# Patient Record
Sex: Male | Born: 1965 | Race: White | Hispanic: No | Marital: Single | Smoking: Never smoker
Health system: Southern US, Community
[De-identification: ages and names within clinical notes are randomized; demographics above are authoritative.]

## PROBLEM LIST (undated history)

## (undated) DIAGNOSIS — E119 Type 2 diabetes mellitus without complications: Secondary | ICD-10-CM

## (undated) DIAGNOSIS — I10 Essential (primary) hypertension: Secondary | ICD-10-CM

## (undated) DIAGNOSIS — E785 Hyperlipidemia, unspecified: Secondary | ICD-10-CM

## (undated) HISTORY — PX: PILONIDAL CYST / SINUS EXCISION: SUR543

## (undated) HISTORY — DX: Essential (primary) hypertension: I10

## (undated) HISTORY — DX: Hyperlipidemia, unspecified: E78.5

---

## 2014-09-03 ENCOUNTER — Emergency Department: Payer: Self-pay

## 2014-09-03 ENCOUNTER — Other Ambulatory Visit: Payer: Self-pay

## 2014-09-03 ENCOUNTER — Encounter: Payer: Self-pay | Admitting: *Deleted

## 2014-09-03 DIAGNOSIS — N200 Calculus of kidney: Secondary | ICD-10-CM | POA: Insufficient documentation

## 2014-09-03 LAB — CBC WITH DIFFERENTIAL/PLATELET
BASOS PCT: 0 %
Basophils Absolute: 0 10*3/uL (ref 0–0.1)
EOS ABS: 0 10*3/uL (ref 0–0.7)
EOS PCT: 1 %
HCT: 47.3 % (ref 40.0–52.0)
HEMOGLOBIN: 16.3 g/dL (ref 13.0–18.0)
LYMPHS PCT: 28 %
Lymphs Abs: 2.7 10*3/uL (ref 1.0–3.6)
MCH: 31.4 pg (ref 26.0–34.0)
MCHC: 34.5 g/dL (ref 32.0–36.0)
MCV: 91 fL (ref 80.0–100.0)
MONOS PCT: 6 %
Monocytes Absolute: 0.6 10*3/uL (ref 0.2–1.0)
Neutro Abs: 6.3 10*3/uL (ref 1.4–6.5)
Neutrophils Relative %: 65 %
PLATELETS: 208 10*3/uL (ref 150–440)
RBC: 5.19 MIL/uL (ref 4.40–5.90)
RDW: 13.3 % (ref 11.5–14.5)
WBC: 9.6 10*3/uL (ref 3.8–10.6)

## 2014-09-03 LAB — COMPREHENSIVE METABOLIC PANEL
ALT: 141 U/L — ABNORMAL HIGH (ref 17–63)
AST: 92 U/L — ABNORMAL HIGH (ref 15–41)
Albumin: 4.4 g/dL (ref 3.5–5.0)
Alkaline Phosphatase: 59 U/L (ref 38–126)
Anion gap: 10 (ref 5–15)
BUN: 21 mg/dL — AB (ref 6–20)
CALCIUM: 9.1 mg/dL (ref 8.9–10.3)
CO2: 27 mmol/L (ref 22–32)
CREATININE: 1.61 mg/dL — AB (ref 0.61–1.24)
Chloride: 104 mmol/L (ref 101–111)
GFR calc Af Amer: 56 mL/min — ABNORMAL LOW (ref >60)
GFR calc non Af Amer: 49 mL/min — ABNORMAL LOW (ref >60)
GLUCOSE: 148 mg/dL — AB (ref 65–99)
Potassium: 4 mmol/L (ref 3.5–5.1)
Sodium: 141 mmol/L (ref 135–145)
TOTAL PROTEIN: 8.1 g/dL (ref 6.5–8.1)
Total Bilirubin: 0.5 mg/dL (ref 0.3–1.2)

## 2014-09-03 LAB — URINALYSIS COMPLETE WITH MICROSCOPIC (ARMC ONLY)
Bacteria, UA: NONE SEEN
Bilirubin Urine: NEGATIVE
GLUCOSE, UA: 50 mg/dL — AB
LEUKOCYTES UA: NEGATIVE
NITRITE: NEGATIVE
PH: 5 (ref 5.0–8.0)
Protein, ur: 30 mg/dL — AB
SPECIFIC GRAVITY, URINE: 1.033 — AB (ref 1.005–1.030)
SQUAMOUS EPITHELIAL / LPF: NONE SEEN

## 2014-09-03 LAB — TROPONIN I

## 2014-09-03 MED ORDER — OXYCODONE-ACETAMINOPHEN 5-325 MG PO TABS
1.0000 | ORAL_TABLET | Freq: Once | ORAL | Status: AC
Start: 2014-09-03 — End: 2014-09-03
  Administered 2014-09-03: 1 via ORAL

## 2014-09-03 MED ORDER — OXYCODONE-ACETAMINOPHEN 5-325 MG PO TABS
ORAL_TABLET | ORAL | Status: AC
  Administered 2014-09-03: 1 via ORAL
  Filled 2014-09-03: qty 1

## 2014-09-03 MED ORDER — ONDANSETRON 8 MG PO TBDP
ORAL_TABLET | ORAL | Status: AC
  Administered 2014-09-03: 8 mg via ORAL
  Filled 2014-09-03: qty 1

## 2014-09-03 MED ORDER — ONDANSETRON 8 MG PO TBDP
8.0000 mg | ORAL_TABLET | Freq: Once | ORAL | Status: AC
  Administered 2014-09-03: 8 mg via ORAL

## 2014-09-03 NOTE — ED Notes (Signed)
Pt states left sided flank pain that began at 1700 today. Pt denies nausea, vomiting, fever, or chills. Pt states no known hematuria.

## 2014-09-04 ENCOUNTER — Emergency Department
Admission: EM | Admit: 2014-09-04 | Discharge: 2014-09-04 | Disposition: A | Discharge status: Home / Self Care | Payer: Self-pay | Attending: Emergency Medicine | Admitting: Emergency Medicine

## 2014-09-04 DIAGNOSIS — N200 Calculus of kidney: Secondary | ICD-10-CM

## 2014-09-04 MED ORDER — ONDANSETRON 4 MG PO TBDP
4.0000 mg | ORAL_TABLET | Freq: Once | ORAL | Status: AC
  Administered 2014-09-04: 4 mg via ORAL

## 2014-09-04 MED ORDER — IBUPROFEN 800 MG PO TABS: 800.0000 mg | ORAL_TABLET | Freq: Three times a day (TID) | ORAL | Status: AC | PRN

## 2014-09-04 MED ORDER — ONDANSETRON HCL 4 MG PO TABS: 4.0000 mg | ORAL_TABLET | Freq: Three times a day (TID) | ORAL | Status: DC | PRN

## 2014-09-04 MED ORDER — OXYCODONE-ACETAMINOPHEN 5-325 MG PO TABS
1.0000 | ORAL_TABLET | Freq: Once | ORAL | Status: AC
  Administered 2014-09-04: 1 via ORAL

## 2014-09-04 MED ORDER — TAMSULOSIN HCL 0.4 MG PO CAPS
0.4000 mg | ORAL_CAPSULE | Freq: Once | ORAL | Status: AC
  Administered 2014-09-04: 0.4 mg via ORAL

## 2014-09-04 MED ORDER — ONDANSETRON 4 MG PO TBDP
ORAL_TABLET | ORAL | Status: AC
  Filled 2014-09-04: qty 1

## 2014-09-04 MED ORDER — KETOROLAC TROMETHAMINE 30 MG/ML IJ SOLN
30.0000 mg | Freq: Once | INTRAMUSCULAR | Status: AC
  Administered 2014-09-04: 30 mg via INTRAVENOUS

## 2014-09-04 MED ORDER — OXYCODONE-ACETAMINOPHEN 5-325 MG PO TABS
ORAL_TABLET | ORAL | Status: AC
  Filled 2014-09-04: qty 1

## 2014-09-04 MED ORDER — TAMSULOSIN HCL 0.4 MG PO CAPS: 0.4000 mg | ORAL_CAPSULE | Freq: Once | ORAL | Status: DC

## 2014-09-04 MED ORDER — OXYCODONE-ACETAMINOPHEN 5-325 MG PO TABS: 1.0000 | ORAL_TABLET | ORAL | Status: DC | PRN

## 2014-09-04 MED ORDER — TAMSULOSIN HCL 0.4 MG PO CAPS
ORAL_CAPSULE | ORAL | Status: AC
  Filled 2014-09-04: qty 1

## 2014-09-04 MED ORDER — KETOROLAC TROMETHAMINE 30 MG/ML IJ SOLN
INTRAMUSCULAR | Status: AC
  Filled 2014-09-04: qty 1

## 2014-09-04 MED ORDER — SODIUM CHLORIDE 0.9 % IV BOLUS (SEPSIS)
1000.0000 mL | Freq: Once | INTRAVENOUS | Status: AC
  Administered 2014-09-04: 1000 mL via INTRAVENOUS

## 2014-09-04 NOTE — Discharge Instructions (Signed)
1. Take medicines as needed for pain & nausea (Motrin/Percocet/Zofran). 2. Take Flomax as prescribed. 3. Drink plenty of bottled or filtered water daily. 4. Return to the ER for worsening symptoms, persistent vomiting, fever, difficulty breathing or other concerns.  Kidney Stones Kidney stones (urolithiasis) are deposits that form inside your kidneys. The intense pain is caused by the stone moving through the urinary tract. When the stone moves, the ureter goes into spasm around the stone. The stone is usually passed in the urine.  CAUSES   A disorder that makes certain neck glands produce too much parathyroid hormone (primary hyperparathyroidism).  A buildup of uric acid crystals, similar to gout in your joints.  Narrowing (stricture) of the ureter.  A kidney obstruction present at birth (congenital obstruction).  Previous surgery on the kidney or ureters.  Numerous kidney infections. SYMPTOMS   Feeling sick to your stomach (nauseous).  Throwing up (vomiting).  Blood in the urine (hematuria).  Pain that usually spreads (radiates) to the groin.  Frequency or urgency of urination. DIAGNOSIS   Taking a history and physical exam.  Blood or urine tests.  CT scan.  Occasionally, an examination of the inside of the urinary bladder (cystoscopy) is performed. TREATMENT   Observation.  Increasing your fluid intake.  Extracorporeal shock wave lithotripsy--This is a noninvasive procedure that uses shock waves to break up kidney stones.  Surgery may be needed if you have severe pain or persistent obstruction. There are various surgical procedures. Most of the procedures are performed with the use of small instruments. Only small incisions are needed to accommodate these instruments, so recovery time is minimized. The size, location, and chemical composition are all important variables that will determine the proper choice of action for you. Talk to your health care provider to  better understand your situation so that you will minimize the risk of injury to yourself and your kidney.  HOME CARE INSTRUCTIONS   Drink enough water and fluids to keep your urine clear or pale yellow. This will help you to pass the stone or stone fragments.  Strain all urine through the provided strainer. Keep all particulate matter and stones for your health care provider to see. The stone causing the pain may be as small as a grain of salt. It is very important to use the strainer each and every time you pass your urine. The collection of your stone will allow your health care provider to analyze it and verify that a stone has actually passed. The stone analysis will often identify what you can do to reduce the incidence of recurrences.  Only take over-the-counter or prescription medicines for pain, discomfort, or fever as directed by your health care provider.  Make a follow-up appointment with your health care provider as directed.  Get follow-up X-rays if required. The absence of pain does not always mean that the stone has passed. It may have only stopped moving. If the urine remains completely obstructed, it can cause loss of kidney function or even complete destruction of the kidney. It is your responsibility to make sure X-rays and follow-ups are completed. Ultrasounds of the kidney can show blockages and the status of the kidney. Ultrasounds are not associated with any radiation and can be performed easily in a matter of minutes. SEEK MEDICAL CARE IF:  You experience pain that is progressive and unresponsive to any pain medicine you have been prescribed. SEEK IMMEDIATE MEDICAL CARE IF:   Pain cannot be controlled with the prescribed medicine.  You have a fever or shaking chills.  The severity or intensity of pain increases over 18 hours and is not relieved by pain medicine.  You develop a new onset of abdominal pain.  You feel faint or pass out.  You are unable to  urinate. MAKE SURE YOU:   Understand these instructions.  Will watch your condition.  Will get help right away if you are not doing well or get worse. Document Released: 03/24/2005 Document Revised: 11/24/2012 Document Reviewed: 08/25/2012 [facility] Patient Information 2015 [place], [place]. This information is not intended to replace advice given to you by your health care provider. Make sure you discuss any questions you have with your health care provider.

## 2014-09-04 NOTE — ED Provider Notes (Signed)
[facility] Emergency Department Provider Note  ____________________________________________  Time seen: Approximately 4:30 AM  I have reviewed the triage vital signs and the nursing notes.   HISTORY  Chief Complaint Flank Pain    HPI Jeffrey Boyle is a 49 y.o. male who presents with left-sided flank pain onset approximately 5 PM. Patient describes sudden onset, nonradiating left flank pain, maximally 10/10. Patient has not experienced pain like this previously. Nothing made the pain better or worse. Patient denies nausea, vomiting, diarrhea, dysuria, testicular tenderness or swelling. Patient denies trauma/injury.   Past Medical History No history of kidney stones  There are no active problems to display for this patient.   Past Surgical History  Procedure Laterality Date  . Pilonidal cyst / sinus excision     Medications None  Allergies Review of patient's allergies indicates no known allergies.  History reviewed. No pertinent family history.  Social History History  Substance Use Topics  . Smoking status: Never Smoker   . Smokeless tobacco: Never Used  . Alcohol Use: Yes    Review of Systems Constitutional: No fever/chills Eyes: No visual changes. ENT: No sore throat. Cardiovascular: Denies chest pain. Respiratory: Denies shortness of breath. Gastrointestinal: No abdominal pain.  No nausea, no vomiting.  No diarrhea.  No constipation. Genitourinary: Negative for dysuria. Musculoskeletal: Positive for back pain. Skin: Negative for rash. Neurological: Negative for headaches, focal weakness or numbness.  10-point ROS otherwise negative.  ____________________________________________   PHYSICAL EXAM:  VITAL SIGNS: ED Triage Vitals  Enc Vitals Group     BP 09/03/14 2153 133/95 mmHg     Pulse Rate 09/03/14 2153 98     Resp 09/03/14 2153 14     Temp 09/03/14 2153 98.4 F (36.9 C)     Temp src --      SpO2 09/03/14 2153 95 %      Weight 09/03/14 2153 210 lb (95.255 kg)     Height 09/03/14 2153  (1.727 m)     Head Cir --      Peak Flow --      Pain Score 09/03/14 2153 8     Pain Loc --      Pain Edu? --      Excl. in GC? --     Constitutional: Alert and oriented. Well appearing and in no acute distress. Eyes: Conjunctivae are normal. PERRL. EOMI. Head: Atraumatic. Nose: No congestion/rhinnorhea. Mouth/Throat: Mucous membranes are moist.  Oropharynx non-erythematous. Neck: No stridor.  Cardiovascular: Normal rate, regular rhythm. Grossly normal heart sounds.  Good peripheral circulation. Respiratory: Normal respiratory effort.  No retractions. Lungs CTAB. Gastrointestinal: Soft and nontender. No distention. No abdominal bruits. No CVA tenderness. Musculoskeletal: No lower extremity tenderness nor edema.  No joint effusions. Neurologic:  Normal speech and language. No gross focal neurologic deficits are appreciated. Speech is normal. No gait instability. Skin:  Skin is warm, dry and intact. No rash noted. Psychiatric: Mood and affect are normal. Speech and behavior are normal.  ____________________________________________   LABS (all labs ordered are listed, but only abnormal results are displayed)  Labs Reviewed  COMPREHENSIVE METABOLIC PANEL - Abnormal; Notable for the following:    Glucose, Bld 148 (*)    BUN 21 (*)    Creatinine, Ser 1.61 (*)    AST 92 (*)    ALT 141 (*)    GFR calc non Af Amer 49 (*)    GFR calc Af Amer 56 (*)    All other components  within normal limits  URINALYSIS COMPLETEWITH MICROSCOPIC ([facility] ONLY) - Abnormal; Notable for the following:    Color, Urine YELLOW (*)    APPearance HAZY (*)    Glucose, UA 50 (*)    Ketones, ur TRACE (*)    Specific Gravity, Urine 1.033 (*)    Hgb urine dipstick 3+ (*)    Protein, ur 30 (*)    All other components within normal limits  CBC WITH DIFFERENTIAL/PLATELET  TROPONIN I    ____________________________________________  EKG  ED ECG REPORT I, Kimi Kroft J, the attending physician, personally viewed and interpreted this ECG.   Date: 09/04/2014  EKG Time: 2215  Rate: 86  Rhythm: normal EKG, normal sinus rhythm  Axis: Normal  Intervals:none  ST&T Change: Nonspecific  ____________________________________________  RADIOLOGY  CT abdomen and pelvis without contrast interpreted per Dr. Manus GunningEhinger: 1. Obstructing 2 mm stone at the left ureterovesicular junction with resultant mild hydroureteronephrosis and perinephric stranding. Additional nonobstructing stones in both kidneys. 2. Incidental findings of hepatic steatosis and diverticulosis, no diverticulitis. 3. Stomach distended with ingested contents. This can be seen in the setting of recent meal ingestion, correlation recommended to exclude gastroparesis.  ____________________________________________   PROCEDURES  Procedure(s) performed: None  Critical Care performed: No  ____________________________________________   INITIAL IMPRESSION / ASSESSMENT AND PLAN / ED COURSE  Pertinent labs & imaging results that were available during my care of the patient were reviewed by me and considered in my medical decision making (see chart for details).  49 year old male with left flank pain secondary to nephrolithiasis. Pain-free after IV Toradol and fluids. Plan for analgesia, antiemetic, Flomax and PCP follow-up. Strict return precautions given. Patient verbalizes understanding and agrees with plan of care. ____________________________________________   FINAL CLINICAL IMPRESSION(S) / ED DIAGNOSES  Final diagnoses:  Kidney stones      Irean HongJade J Khy Pitre, MD 09/04/14 [phone]

## 2018-06-01 ENCOUNTER — Emergency Department: Payer: BLUE CROSS/BLUE SHIELD

## 2018-06-01 ENCOUNTER — Inpatient Hospital Stay
Admission: EM | Admit: 2018-06-01 | Discharge: 2018-06-07 | DRG: 392 | Disposition: A | Discharge status: Home / Self Care | Payer: BLUE CROSS/BLUE SHIELD | Attending: General Surgery | Admitting: General Surgery

## 2018-06-01 ENCOUNTER — Encounter: Payer: Self-pay | Admitting: Emergency Medicine

## 2018-06-01 DIAGNOSIS — R1032 Left lower quadrant pain: Secondary | ICD-10-CM | POA: Diagnosis present

## 2018-06-01 DIAGNOSIS — N420 Calculus of prostate: Secondary | ICD-10-CM | POA: Diagnosis present

## 2018-06-01 DIAGNOSIS — Z87442 Personal history of urinary calculi: Secondary | ICD-10-CM

## 2018-06-01 DIAGNOSIS — N200 Calculus of kidney: Secondary | ICD-10-CM | POA: Diagnosis present

## 2018-06-01 DIAGNOSIS — B9620 Unspecified Escherichia coli [E. coli] as the cause of diseases classified elsewhere: Secondary | ICD-10-CM | POA: Diagnosis present

## 2018-06-01 DIAGNOSIS — K5720 Diverticulitis of large intestine with perforation and abscess without bleeding: Principal | ICD-10-CM | POA: Diagnosis present

## 2018-06-01 DIAGNOSIS — Z9049 Acquired absence of other specified parts of digestive tract: Secondary | ICD-10-CM

## 2018-06-01 DIAGNOSIS — Z6832 Body mass index (BMI) 32.0-32.9, adult: Secondary | ICD-10-CM

## 2018-06-01 DIAGNOSIS — K449 Diaphragmatic hernia without obstruction or gangrene: Secondary | ICD-10-CM | POA: Diagnosis present

## 2018-06-01 DIAGNOSIS — E669 Obesity, unspecified: Secondary | ICD-10-CM | POA: Diagnosis present

## 2018-06-01 DIAGNOSIS — Z79899 Other long term (current) drug therapy: Secondary | ICD-10-CM

## 2018-06-01 DIAGNOSIS — J101 Influenza due to other identified influenza virus with other respiratory manifestations: Secondary | ICD-10-CM | POA: Diagnosis present

## 2018-06-01 DIAGNOSIS — K5792 Diverticulitis of intestine, part unspecified, without perforation or abscess without bleeding: Secondary | ICD-10-CM

## 2018-06-01 DIAGNOSIS — I700 Atherosclerosis of aorta: Secondary | ICD-10-CM | POA: Diagnosis present

## 2018-06-01 DIAGNOSIS — J329 Chronic sinusitis, unspecified: Secondary | ICD-10-CM | POA: Diagnosis present

## 2018-06-01 DIAGNOSIS — K668 Other specified disorders of peritoneum: Secondary | ICD-10-CM | POA: Diagnosis present

## 2018-06-01 LAB — URINALYSIS, COMPLETE (UACMP) WITH MICROSCOPIC
Bacteria, UA: NONE SEEN
Bilirubin Urine: NEGATIVE
Glucose, UA: >=500 mg/dL — AB
Ketones, ur: NEGATIVE mg/dL
Leukocytes,Ua: NEGATIVE
Nitrite: NEGATIVE
Protein, ur: NEGATIVE mg/dL
SPECIFIC GRAVITY, URINE: 1.039 — AB (ref 1.005–1.030)
SQUAMOUS EPITHELIAL / LPF: NONE SEEN (ref 0–5)
pH: 5 (ref 5.0–8.0)

## 2018-06-01 LAB — COMPREHENSIVE METABOLIC PANEL
ALBUMIN: 3.7 g/dL (ref 3.5–5.0)
ALT: 159 U/L — ABNORMAL HIGH (ref 0–44)
AST: 88 U/L — ABNORMAL HIGH (ref 15–41)
Alkaline Phosphatase: 145 U/L — ABNORMAL HIGH (ref 38–126)
Anion gap: 12 (ref 5–15)
BILIRUBIN TOTAL: 1 mg/dL (ref 0.3–1.2)
BUN: 25 mg/dL — ABNORMAL HIGH (ref 6–20)
CO2: 24 mmol/L (ref 22–32)
Calcium: 8.9 mg/dL (ref 8.9–10.3)
Chloride: 95 mmol/L — ABNORMAL LOW (ref 98–111)
Creatinine, Ser: 1.18 mg/dL (ref 0.61–1.24)
GFR calc Af Amer: >60 mL/min (ref >60)
GFR calc non Af Amer: >60 mL/min (ref >60)
GLUCOSE: 308 mg/dL — AB (ref 70–99)
Potassium: 4 mmol/L (ref 3.5–5.1)
Sodium: 131 mmol/L — ABNORMAL LOW (ref 135–145)
TOTAL PROTEIN: 7.7 g/dL (ref 6.5–8.1)

## 2018-06-01 LAB — CBC WITH DIFFERENTIAL/PLATELET
Abs Immature Granulocytes: 0.08 10*3/uL — ABNORMAL HIGH (ref 0.00–0.07)
BASOS ABS: 0 10*3/uL (ref 0.0–0.1)
Basophils Relative: 0 %
Eosinophils Absolute: 0 10*3/uL (ref 0.0–0.5)
Eosinophils Relative: 0 %
HEMATOCRIT: 49.5 % (ref 39.0–52.0)
HEMOGLOBIN: 16.8 g/dL (ref 13.0–17.0)
IMMATURE GRANULOCYTES: 1 %
LYMPHS ABS: 3 10*3/uL (ref 0.7–4.0)
LYMPHS PCT: 21 %
MCH: 30.5 pg (ref 26.0–34.0)
MCHC: 33.9 g/dL (ref 30.0–36.0)
MCV: 89.8 fL (ref 80.0–100.0)
Monocytes Absolute: 1.2 10*3/uL — ABNORMAL HIGH (ref 0.1–1.0)
Monocytes Relative: 8 %
NEUTROS PCT: 70 %
NRBC: 0 % (ref 0.0–0.2)
Neutro Abs: 10.1 10*3/uL — ABNORMAL HIGH (ref 1.7–7.7)
Platelets: 278 10*3/uL (ref 150–400)
RBC: 5.51 MIL/uL (ref 4.22–5.81)
RDW: 11.8 % (ref 11.5–15.5)
WBC: 14.4 10*3/uL — ABNORMAL HIGH (ref 4.0–10.5)

## 2018-06-01 LAB — INFLUENZA PANEL BY PCR (TYPE A & B)
Influenza A By PCR: POSITIVE — AB
Influenza B By PCR: NEGATIVE

## 2018-06-01 MED ORDER — PIPERACILLIN-TAZOBACTAM 3.375 G IVPB
3.3750 g | Freq: Three times a day (TID) | INTRAVENOUS | Status: DC
  Administered 2018-06-01 – 2018-06-07 (×18): 3.375 g via INTRAVENOUS
  Filled 2018-06-01 (×18): qty 50

## 2018-06-01 MED ORDER — ENOXAPARIN SODIUM 40 MG/0.4ML ~~LOC~~ SOLN
40.0000 mg | SUBCUTANEOUS | Status: DC
  Administered 2018-06-01 – 2018-06-06 (×6): 40 mg via SUBCUTANEOUS
  Filled 2018-06-01 (×6): qty 0.4

## 2018-06-01 MED ORDER — ONDANSETRON 4 MG PO TBDP: 4.0000 mg | ORAL_TABLET | Freq: Four times a day (QID) | ORAL | Status: DC | PRN

## 2018-06-01 MED ORDER — SODIUM CHLORIDE 0.9 % IV SOLN
INTRAVENOUS | Status: DC | PRN
  Administered 2018-06-01 – 2018-06-07 (×8): 250 mL via INTRAVENOUS

## 2018-06-01 MED ORDER — SODIUM CHLORIDE 0.9 % IV SOLN
Freq: Once | INTRAVENOUS | Status: AC
Start: 2018-06-01 — End: 2018-06-01
  Administered 2018-06-01: 09:00:00 via INTRAVENOUS

## 2018-06-01 MED ORDER — KETOROLAC TROMETHAMINE 30 MG/ML IJ SOLN: 30.0000 mg | Freq: Four times a day (QID) | INTRAMUSCULAR | Status: DC | PRN

## 2018-06-01 MED ORDER — PIPERACILLIN-TAZOBACTAM 3.375 G IVPB 30 MIN
3.3750 g | Freq: Once | INTRAVENOUS | Status: AC
  Administered 2018-06-01: 3.375 g via INTRAVENOUS
  Filled 2018-06-01: qty 50

## 2018-06-01 MED ORDER — SODIUM CHLORIDE 0.9 % IV SOLN
Freq: Once | INTRAVENOUS | Status: AC
Start: 2018-06-01 — End: 2018-06-01
  Administered 2018-06-01: 08:00:00 via INTRAVENOUS

## 2018-06-01 MED ORDER — KETOROLAC TROMETHAMINE 30 MG/ML IJ SOLN: 30.0000 mg | Freq: Four times a day (QID) | INTRAMUSCULAR | Status: DC

## 2018-06-01 MED ORDER — LACTATED RINGERS IV SOLN
INTRAVENOUS | Status: DC
  Administered 2018-06-01 – 2018-06-06 (×12): via INTRAVENOUS

## 2018-06-01 MED ORDER — MORPHINE SULFATE (PF) 4 MG/ML IV SOLN
4.0000 mg | Freq: Once | INTRAVENOUS | Status: AC
  Administered 2018-06-01: 4 mg via INTRAVENOUS
  Filled 2018-06-01: qty 1

## 2018-06-01 MED ORDER — METOPROLOL TARTRATE 5 MG/5ML IV SOLN
5.0000 mg | Freq: Four times a day (QID) | INTRAVENOUS | Status: DC | PRN
  Administered 2018-06-04: 5 mg via INTRAVENOUS
  Filled 2018-06-01: qty 5

## 2018-06-01 MED ORDER — ONDANSETRON HCL 4 MG/2ML IJ SOLN
4.0000 mg | Freq: Once | INTRAMUSCULAR | Status: AC
  Administered 2018-06-01: 4 mg via INTRAVENOUS
  Filled 2018-06-01: qty 2

## 2018-06-01 MED ORDER — MORPHINE SULFATE (PF) 2 MG/ML IV SOLN
2.0000 mg | INTRAVENOUS | Status: DC | PRN
  Administered 2018-06-01 – 2018-06-04 (×4): 2 mg via INTRAVENOUS
  Filled 2018-06-01 (×5): qty 1

## 2018-06-01 MED ORDER — ONDANSETRON HCL 4 MG/2ML IJ SOLN: 4.0000 mg | Freq: Four times a day (QID) | INTRAMUSCULAR | Status: DC | PRN

## 2018-06-01 MED ORDER — OSELTAMIVIR PHOSPHATE 75 MG PO CAPS
75.0000 mg | ORAL_CAPSULE | Freq: Two times a day (BID) | ORAL | Status: DC
  Filled 2018-06-01: qty 1

## 2018-06-01 MED ORDER — KETOROLAC TROMETHAMINE 30 MG/ML IJ SOLN
30.0000 mg | Freq: Four times a day (QID) | INTRAMUSCULAR | Status: AC | PRN
  Administered 2018-06-01 – 2018-06-05 (×10): 30 mg via INTRAVENOUS
  Filled 2018-06-01 (×10): qty 1

## 2018-06-01 MED ORDER — OSELTAMIVIR PHOSPHATE 75 MG PO CAPS
75.0000 mg | ORAL_CAPSULE | Freq: Two times a day (BID) | ORAL | Status: AC
  Administered 2018-06-01 – 2018-06-05 (×9): 75 mg via ORAL
  Filled 2018-06-01 (×8): qty 1

## 2018-06-01 NOTE — H&P (Addendum)
[facility] SURGICAL HISTORY & PHYSICAL (cpt [phone])  HISTORY OF PRESENT ILLNESS (HPI):  53 y.o. male presented to [facility] ED today for abdominal pain. Patient reports the acute onset of sharp LLQ abdominal pain around 4 AM this morning when he was getting up to go to the bathroom. He endorses associated fevers but denied any chills, CP, SOB, nausea, emesis, or bladder/bowel changes. No reports of similar pain in the past. The pain is exacerbated with movement. He has a history of kidney stones and thought this was similar and tried Azo without any change in his pain. Previous abdominal surgeries are positive for appendectomy. Of note, he has been on methylprednisolone 4mg  since Sunday, his last dose being Monday at 12 pm, for a sinus infection. Work up in the ED was concerning for perforated diverticulitis.   General surgery is consulted by emergency medicine physician Dr Cherene Julian, MD for evaluation and management of perforated diverticulitis.    PAST MEDICAL HISTORY (PMH):  History reviewed. No pertinent past medical history.  Reviewed. Otherwise negative.   PAST SURGICAL HISTORY (PSH):  Past Surgical History:  Procedure Laterality Date  . PILONIDAL CYST / SINUS EXCISION      Reviewed. Otherwise negative.   MEDICATIONS:  Prior to Admission medications   Medication Sig Start Date End Date Taking? Authorizing Provider  benzonatate (TESSALON) 200 MG capsule Take 200 mg by mouth 3 (three) times daily. 05/29/18   [provider]  ibuprofen (ADVIL,MOTRIN) 800 MG tablet Take 1 tablet (800 mg total) by mouth every 8 (eight) hours as needed for moderate pain. 09/04/14   Irean Hong, MD  methylPREDNISolone (MEDROL DOSEPAK) 4 MG TBPK tablet Take 4 mg by mouth as directed. 05/29/18   [provider]  ondansetron (ZOFRAN) 4 MG tablet Take 1 tablet (4 mg total) by mouth every 8 (eight) hours as needed for nausea or vomiting. 09/04/14   Irean Hong, MD   oxyCODONE-acetaminophen (ROXICET) 5-325 MG per tablet Take 1 tablet by mouth every 4 (four) hours as needed for severe pain. 09/04/14   Irean Hong, MD  tamsulosin (FLOMAX) 0.4 MG CAPS capsule Take 1 capsule (0.4 mg total) by mouth once. 09/04/14   Irean Hong, MD     ALLERGIES:  No Known Allergies   SOCIAL HISTORY:  Social History   Socioeconomic History  . Marital status: Single    Spouse name: Not on file  . Number of children: Not on file  . Years of education: Not on file  . Highest education level: Not on file  Occupational History  . Not on file  Social Needs  . Financial resource strain: Not on file  . Food insecurity:    Worry: Not on file    Inability: Not on file  . Transportation needs:    Medical: Not on file    Non-medical: Not on file  Tobacco Use  . Smoking status: Never Smoker  . Smokeless tobacco: Never Used  Substance and Sexual Activity  . Alcohol use: Yes  . Drug use: Not on file  . Sexual activity: Not on file  Lifestyle  . Physical activity:    Days per week: Not on file    Minutes per session: Not on file  . Stress: Not on file  Relationships  . Social connections:    Talks on phone: Not on file    Gets together: Not on file    Attends religious service: Not on file  Active member of club or organization: Not on file    Attends meetings of clubs or organizations: Not on file    Relationship status: Not on file  . Intimate partner violence:    Fear of current or ex partner: Not on file    Emotionally abused: Not on file    Physically abused: Not on file    Forced sexual activity: Not on file  Other Topics Concern  . Not on file  Social History Narrative  . Not on file     FAMILY HISTORY:  No family history on file.  Otherwise negative.   REVIEW OF SYSTEMS:  Review of Systems  Constitutional: Positive for fever. Negative for chills.  Respiratory: Negative for cough and sputum production.   Cardiovascular: Negative for chest  pain and palpitations.  Gastrointestinal: Positive for abdominal pain. Negative for blood in stool, constipation, diarrhea, nausea and vomiting.  Genitourinary: Negative for dysuria, hematuria and urgency.  Neurological: Negative for dizziness and headaches.  All other systems reviewed and are negative.   VITAL SIGNS:  Temp:  [100.4 F (38 C)-101.5 F (38.6 C)] 100.4 F (38 C) (02/25 0918) Pulse Rate:  [60] 60 (02/25 0755) Resp:  [18] 18 (02/25 0755) BP: (143-155)/(82-92) 143/92 (02/25 0918) SpO2:  [94 %-95 %] 94 % (02/25 0918) Weight:  [97.5 kg] 97.5 kg (02/25 0755)     Height:  (172.7 cm) Weight: 97.5 kg BMI (Calculated): 32.7   PHYSICAL EXAM:  Physical Exam Vitals signs and nursing note reviewed.  Constitutional:      General: He is not in acute distress.    Appearance: He is well-developed. He is obese. He is not ill-appearing.  HENT:     Head: Normocephalic and atraumatic.  Eyes:     General: No scleral icterus.    Extraocular Movements: Extraocular movements intact.  Cardiovascular:     Rate and Rhythm: Normal rate and regular rhythm.     Heart sounds: Normal heart sounds. No murmur. No friction rub. No gallop.   Pulmonary:     Effort: Pulmonary effort is normal. No respiratory distress.     Breath sounds: Normal breath sounds. No stridor. No wheezing.  Abdominal:     General: Abdomen is protuberant. There is no distension.     Palpations: Abdomen is soft.     Tenderness: There is abdominal tenderness in the suprapubic area and left lower quadrant. There is no guarding or rebound.  Genitourinary:    Comments: Deferred Skin:    General: Skin is warm and dry.     Coloration: Skin is not jaundiced or pale.  Neurological:     General: No focal deficit present.     Mental Status: He is alert and oriented to person, place, and time.  Psychiatric:        Mood and Affect: Mood normal.        Behavior: Behavior normal.     INTAKE/OUTPUT:  This shift: No  intake/output data recorded.  Last 2 shifts: @  Labs:  CBC Latest Ref Rng & Units 06/01/2018 09/03/2014  WBC 4.0 - 10.5 K/uL 14.4(H) 9.6  Hemoglobin 13.0 - 17.0 g/dL 28.4 13.2  Hematocrit 44.0 - 52.0 % 49.5 47.3  Platelets 150 - 400 K/uL 278 208   CMP Latest Ref Rng & Units 06/01/2018 09/03/2014  Glucose 70 - 99 mg/dL 102(V) 253(G)  BUN 6 - 20 mg/dL 64(Q) 03(K)  Creatinine 0.61 - 1.24 mg/dL 7.42 5.95(G)  Sodium 387 - 145 mmol/L  131(L) 141  Potassium 3.5 - 5.1 mmol/L 4.0 4.0  Chloride 98 - 111 mmol/L 95(L) 104  CO2 22 - 32 mmol/L 24 27  Calcium 8.9 - 10.3 mg/dL 8.9 9.1  Total Protein 6.5 - 8.1 g/dL 7.7 8.1  Total Bilirubin 0.3 - 1.2 mg/dL 1.0 0.5  Alkaline Phos 38 - 126 U/L 145(H) 59  AST 15 - 41 U/L 88(H) 92(H)  ALT 0 - 44 U/L 159(H) 141(H)     Imaging studies:   CT Renal Stone Study (2018/06/23) personally reviewed and radiologist reviewed:  IMPRESSION: 1) Pneumoperitoneum, apparently from perforation in the mid sigmoid colon region. A small amount of fluid as well as mesenteric stranding is noted in this area, presumably secondary to diverticulitis. No well-defined abscess evident. 2) No appreciable bowel obstruction.  Small hiatal hernia noted. 3) Nonobstructing calculi in each kidney, more on the right than on the left. No ureteral calculus or hydronephrosis on either side. There are occasional prostatic calculi. 4) There is aortic atherosclerosis.    Imaging was personally reviewed by Dr. Lady Gary.  The air is in scattered locules throughout the abdomen, rather than a single large-volume of free air.  Assessment/Plan: (ICD-10's: K54.20) 53 y.o. male with fever, leukocytosis, and LLQ abdominal pain as well as pneumoperitoneum on imaging studies most likely consistent with perforated diverticulitis which complicated by 3 days of methylprednisolone use as well as by pertinent comorbidities including obesity.    - Admit to general surgery service  - NPO, IVF  resuscitation (LR 125 ml/hr), IV ABx (Zosyn)   - Continue to monitor abdominal exam, on-going bowel function  - Pain control prn, antiemetics prn  - Morning labs (CBC/BMP)  - Given clinical picture, imaging findings (lack of abscess, limited free air), and current steroid use, will attempt conservative management as outlined above. The patient understands if he acutely worsens or does not respond he will have to have Hartman's Procedure which will likely result in temporarycolostomy. He is understanding and agreeable with this plan.    - Medical management of comorbidities   - DVT prophylaxis  All of the above findings and recommendations were discussed with the patient, and all of his questions were answered to his expressed satisfaction.  -- Lynden Oxford, PA-C [facility] 06/01/2018, 9:33 AM [phone] M-F: 7am - 4pm   I saw and evaluated the patient.  I agree with the above documentation, exam, and plan, which I have edited where appropriate. I also consulted with my partner, Dr. Everlene Farrier who reviewed the imaging along with me.  Based upon clinical presentation, will manage conservatively for now, but will reassess frequently and have a low threshold for altering the plan, should his clinical course dictate. Duanne Guess  10:14 AM

## 2018-06-01 NOTE — ED Notes (Signed)
Surgeon in room to assess patient.  Will continue to monitor.   

## 2018-06-01 NOTE — ED Notes (Signed)
Patient states he last drank something prior to arrival this morning, water, around 7am and last ate at 7pm yesterday.

## 2018-06-01 NOTE — ED Triage Notes (Addendum)
Patient presents to the ED with left lower abdominal pain that began suddenly at 4am.  Patient reports pain intermittently radiating into penis and scrotum.  Patient denies vomiting and diarrhea.  Patient states he has been taking prednisone for a sinus infection.  Patient is in no obvious distress at this time.  Patient's family members have recently been diagnosed with flu.  Patient denies vomiting and diarrhea.

## 2018-06-01 NOTE — ED Notes (Signed)
ED TO INPATIENT HANDOFF REPORT  Name/Age/Gender Jeffrey Boyle 53 y.o. male  Code Status    Code Status Orders  (From admission, onward)         Start     Ordered   06/01/18 0931  Full code  Continuous     06/01/18 0933        Code Status History    This patient has a current code status but no historical code status.      Home/SNF/Other Home  Chief Complaint abd pain  Level of Care/Admitting Diagnosis ED Disposition    ED Disposition Condition Comment   Admit  Hospital Area: [facility] [[id number]]  Level of Care: Med-Surg [16]  Diagnosis: Diverticulitis of large intestine with perforation without bleeding [[id number]]  Admitting Physician: Shana Chute  Attending Physician: Shana Chute  Estimated length of stay: 3 - 4 days  Certification:: I certify this patient will need inpatient services for at least 2 midnights  PT Class (Do Not Modify): Inpatient [101]  PT Acc Code (Do Not Modify): Private [1]       Medical History History reviewed. No pertinent past medical history.  Allergies No Known Allergies  IV Location/Drains/Wounds Patient Lines/Drains/Airways Status   Active Line/Drains/Airways    Name:   Placement date:   Placement time:   Site:   Days:   Peripheral IV 06/01/18 Right Antecubital   06/01/18    0818    Antecubital   less than 1   Peripheral IV 06/01/18 Left Hand   06/01/18    0913    Hand   less than 1          Labs/Imaging Results for orders placed or performed during the hospital encounter of 06/01/18 (from the past 48 hour(s))  CBC with Differential/Platelet     Status: Abnormal   Collection Time: 06/01/18  8:07 AM  Result Value Ref Range   WBC 14.4 (H) 4.0 - 10.5 K/uL   RBC 5.51 4.22 - 5.81 MIL/uL   Hemoglobin 16.8 13.0 - 17.0 g/dL   HCT 33.3 54.5 - 62.5 %   MCV 89.8 80.0 - 100.0 fL   MCH 30.5 26.0 - 34.0 pg   MCHC 33.9 30.0 - 36.0 g/dL   RDW 63.8 93.7 - 34.2 %   Platelets 278 150  - 400 K/uL   nRBC 0.0 0.0 - 0.2 %   Neutrophils Relative % 70 %   Neutro Abs 10.1 (H) 1.7 - 7.7 K/uL   Lymphocytes Relative 21 %   Lymphs Abs 3.0 0.7 - 4.0 K/uL   Monocytes Relative 8 %   Monocytes Absolute 1.2 (H) 0.1 - 1.0 K/uL   Eosinophils Relative 0 %   Eosinophils Absolute 0.0 0.0 - 0.5 K/uL   Basophils Relative 0 %   Basophils Absolute 0.0 0.0 - 0.1 K/uL   Immature Granulocytes 1 %   Abs Immature Granulocytes 0.08 (H) 0.00 - 0.07 K/uL    Comment: Performed at [facility], [address] [place], [place] [address]  Comprehensive metabolic panel     Status: Abnormal   Collection Time: 06/01/18  8:07 AM  Result Value Ref Range   Sodium 131 (L) 135 - 145 mmol/L   Potassium 4.0 3.5 - 5.1 mmol/L    Comment: HEMOLYSIS AT THIS LEVEL MAY AFFECT RESULT   Chloride 95 (L) 98 - 111 mmol/L   CO2 24 22 - 32 mmol/L   Glucose, Bld 308 (H) 70 - 99  mg/dL   BUN 25 (H) 6 - 20 mg/dL   Creatinine, Ser 1.18 0.61 - 1.24 mg/dL   Calcium 8.9 8.9 - 09.610.3 mg/dL   Total Protein 7.7 6.5 - 8.1 g/dL   Albumin 3.7 3.5 - 5.0 g/dL   AST 88 (H) 15 - 411.61 U/L   ALT 159 (H) 0 - 44 U/L   Alkaline Phosphatase 145 (H) 38 - 126 U/L   Total Bilirubin 1.0 0.3 - 1.2 mg/dL   GFR calc non Af Amer >60 >60 mL/min   GFR calc Af Amer >60 >60 mL/min   Anion gap 12 5 - 15    Comment: Performed at [facility], [address] [place], [place] [address]  Urinalysis, Complete w Microscopic     Status: Abnormal   Collection Time: 06/01/18  8:07 AM  Result Value Ref Range   Color, Urine YELLOW (A) YELLOW   APPearance CLEAR (A) CLEAR   Specific Gravity, Urine 1.039 (H) 1.005 - 1.030   pH 5.0 5.0 - 8.0   Glucose, UA >=500 (A) NEGATIVE mg/dL   Hgb urine dipstick MODERATE (A) NEGATIVE   Bilirubin Urine NEGATIVE NEGATIVE   Ketones, ur NEGATIVE NEGATIVE mg/dL   Protein, ur NEGATIVE NEGATIVE mg/dL   Nitrite NEGATIVE NEGATIVE   Leukocytes,Ua NEGATIVE NEGATIVE   RBC / HPF 6-10 0 - 5 RBC/hpf   WBC, UA 0-5  0 - 5 WBC/hpf   Bacteria, UA NONE SEEN NONE SEEN   Squamous Epithelial / LPF NONE SEEN 0 - 5   Mucus PRESENT    Hyaline Casts, UA PRESENT     Comment: Performed at [facility], [address] [place], [place] [address]   Ct Renal Stone Study  Result Date: 06/01/2018 CLINICAL DATA:  Lower abdominal pain, primarily left-sided EXAM: CT ABDOMEN AND PELVIS WITHOUT CONTRAST TECHNIQUE: Multidetector CT imaging of the abdomen and pelvis was performed following the standard protocol without oral or IV contrast. COMPARISON:  Sep 03, 2014 FINDINGS: Lower chest: Lung bases are clear.  There is a small hiatal hernia. Hepatobiliary: No focal liver lesions are apparent on this noncontrast enhanced study. Gallbladder wall is not appreciably thickened. There is no biliary duct dilatation. Pancreas: No pancreatic mass or inflammatory focus. Spleen: No splenic lesions are evident. Adrenals/Urinary Tract: Adrenals appear unremarkable bilaterally. Kidneys bilaterally show no evident mass or hydronephrosis on either side. There is a calculus in the mid right kidney measuring 8 x 8 mm. There are scattered 1-2 mm calculi elsewhere in the right kidney. There is a 2 mm calculus in the mid left kidney with several tiny calculi elsewhere in the left kidney. There is no appreciable ureteral calculus on either side. Urinary bladder is midline with wall thickness within normal limits. Stomach/Bowel: There are multiple sigmoid diverticula. There is stranding focally in the mid sigmoid colon consistent with a degree of diverticulitis. Note that there is evidence of perforation in this area with extraluminal air and fluid. Free air is seen scattered throughout the abdomen and pelvis. No well-defined abscess is seen. No bowel obstruction is evident. Vascular/Lymphatic: There is no abdominal aortic aneurysm. There is aortic atherosclerosis. No evident adenopathy in the abdomen or pelvis. Reproductive: There are occasional  prostatic calculi. Prostate and seminal vesicles are normal in size and contour. There is no pelvic mass evident. Other: There is no periappendiceal region inflammation. Appendix not well seen. There is no abscess or ascites in the abdomen or pelvis. Musculoskeletal: No blastic or lytic bone lesions.  No intramuscular or abdominal wall lesions are evident. IMPRESSION: 1. Pneumoperitoneum, apparently from perforation in the mid sigmoid colon region. A small amount of fluid as well as mesenteric stranding is noted in this area, presumably secondary to diverticulitis. No well-defined abscess evident. 2.  No appreciable bowel obstruction.  Small hiatal hernia noted. 3. Nonobstructing calculi in each kidney, more on the right than on the left. No ureteral calculus or hydronephrosis on either side. There are occasional prostatic calculi. 4.  There is aortic atherosclerosis. Critical Value/emergent results were called by telephone at the time of interpretation on 06/01/2018 at 8:42 am to Dr. Daryel November , who verbally acknowledged these results. Electronically Signed   By: Bretta Bang III M.D.   On: 06/01/2018 08:42    Pending Labs Unresulted Labs (From admission, onward)    Start     Ordered   06/08/18 0500  Creatinine, serum  (enoxaparin (LOVENOX)    CrCl >/= 30 ml/min)  Weekly,   STAT    Comments:  while on enoxaparin therapy    06/01/18 0933   06/02/18 0500  CBC  Tomorrow morning,   STAT     06/01/18 0933   06/02/18 0500  Comprehensive metabolic panel  Tomorrow morning,   STAT     06/01/18 0933   06/02/18 0500  Magnesium  Tomorrow morning,   STAT     06/01/18 0933   06/02/18 0500  Phosphorus  Tomorrow morning,   STAT     06/01/18 0933   06/01/18 0931  HIV antibody (Routine Testing)  Once,   STAT     06/01/18 0933          Vitals/Pain Today's Vitals   06/01/18 1400 06/01/18 1500 06/01/18 1507 06/01/18 1510  BP: 132/78 (!) 143/85    Pulse: 97 (!) 101    Resp:      Temp:    100.1  F (37.8 C)  TempSrc:    Oral  SpO2: 93% 93%    Weight:      Height:      PainSc:   9      Isolation Precautions No active isolations  Medications Medications  0.9 %  sodium chloride infusion (250 mLs Intravenous New Bag/Given 06/01/18 0900)  enoxaparin (LOVENOX) injection 40 mg (has no administration in time range)  lactated ringers infusion ( Intravenous New Bag/Given 06/01/18 1057)  piperacillin-tazobactam (ZOSYN) IVPB 3.375 g (has no administration in time range)  morphine 2 MG/ML injection 2 mg (2 mg Intravenous Given 06/01/18 1136)  ondansetron (ZOFRAN-ODT) disintegrating tablet 4 mg (has no administration in time range)    Or  ondansetron (ZOFRAN) injection 4 mg (has no administration in time range)  metoprolol tartrate (LOPRESSOR) injection 5 mg (has no administration in time range)  ketorolac (TORADOL) 30 MG/ML injection 30 mg (30 mg Intravenous Given 06/01/18 1508)  0.9 %  sodium chloride infusion ( Intravenous Stopped 06/01/18 0929)  morphine 4 MG/ML injection 4 mg (4 mg Intravenous Given 06/01/18 0914)  ondansetron (ZOFRAN) injection 4 mg (4 mg Intravenous Given 06/01/18 0914)  piperacillin-tazobactam (ZOSYN) IVPB 3.375 g ( Intravenous Stopped 06/01/18 0931)  0.9 %  sodium chloride infusion ( Intravenous Stopped 06/01/18 1050)    Mobility walks

## 2018-06-01 NOTE — ED Provider Notes (Signed)
[facility] Emergency Department Provider Note       Time seen: ----------------------------------------- 8:06 AM on 06/01/2018 -----------------------------------------   I have reviewed the triage vital signs and the nursing notes.  HISTORY   Chief Complaint Abdominal Pain    HPI Jeffrey Boyle is a 53 y.o. male with a history of sudden onset of flank pain at 4 AM.  Reports a history of kidney stone once in the past.  He reports the pain is intermittent and radiating to his penis and scrotum.  He denies vomiting or diarrhea.  Currently he is on prednisone for a sinus infection.  History reviewed. No pertinent past medical history.  There are no active problems to display for this patient.   Past Surgical History:  Procedure Laterality Date  . PILONIDAL CYST / SINUS EXCISION      Allergies Patient has no known allergies.  Social History Social History   Tobacco Use  . Smoking status: Never Smoker  . Smokeless tobacco: Never Used  Substance Use Topics  . Alcohol use: Yes  . Drug use: Not on file   Review of Systems Constitutional: Negative for fever. Cardiovascular: Negative for chest pain. Respiratory: Negative for shortness of breath. Gastrointestinal: Positive for flank pain Genitourinary: Negative for dysuria. Musculoskeletal: Negative for back pain. Skin: Negative for rash. Neurological: Negative for headaches, focal weakness or numbness.  All systems negative/normal/unremarkable except as stated in the HPI  ____________________________________________   PHYSICAL EXAM:  VITAL SIGNS: ED Triage Vitals [06/01/18 0755]  Enc Vitals Group     BP (!) 155/82     Pulse Rate 60     Resp 18     Temp (!) 101.5 F (38.6 C)     Temp Source Oral     SpO2 95 %     Weight 215 lb (97.5 kg)     Height 5\' 8"  (1.727 m)     Head Circumference      Peak Flow      Pain Score 5     Pain Loc      Pain Edu?      Excl. in GC?     Constitutional: Alert and oriented. Well appearing and in no distress. Eyes: Conjunctivae are normal. Normal extraocular movements. Cardiovascular: Normal rate, regular rhythm. No murmurs, rubs, or gallops. Respiratory: Normal respiratory effort without tachypnea nor retractions. Breath sounds are clear and equal bilaterally. No wheezes/rales/rhonchi. Gastrointestinal: Left lower quadrant tenderness, questionable rebound.  Normal bowel sounds. Musculoskeletal: Nontender with normal range of motion in extremities. No lower extremity tenderness nor edema. Neurologic:  Normal speech and language. No gross focal neurologic deficits are appreciated.  Skin:  Skin is warm, dry and intact. No rash noted. Psychiatric: Mood and affect are normal. Speech and behavior are normal.  ____________________________________________  ED COURSE:  As part of my medical decision making, I reviewed the following data within the electronic MEDICAL RECORD NUMBER History obtained from family if available, nursing notes, old chart and ekg, as well as notes from prior ED visits. Patient presented for flank pain, we will assess with labs and imaging as indicated at this time.   Procedures ____________________________________________   LABS (pertinent positives/negatives)  Labs Reviewed  CBC WITH DIFFERENTIAL/PLATELET - Abnormal; Notable for the following components:      Result Value   WBC 14.4 (*)    Neutro Abs 10.1 (*)    Monocytes Absolute 1.2 (*)    Abs Immature Granulocytes 0.08 (*)    All  other components within normal limits  URINALYSIS, COMPLETE (UACMP) WITH MICROSCOPIC - Abnormal; Notable for the following components:   Color, Urine YELLOW (*)    APPearance CLEAR (*)    Specific Gravity, Urine 1.039 (*)    Glucose, UA >=500 (*)    Hgb urine dipstick MODERATE (*)    All other components within normal limits  COMPREHENSIVE METABOLIC PANEL   CRITICAL CARE Performed by: Ulice Dash   Total  critical care time: 30 minutes  Critical care time was exclusive of separately billable procedures and treating other patients.  Critical care was necessary to treat or prevent imminent or life-threatening deterioration.  Critical care was time spent personally by me on the following activities: development of treatment plan with patient and/or surrogate as well as nursing, discussions with consultants, evaluation of patient's response to treatment, examination of patient, obtaining history from patient or surrogate, ordering and performing treatments and interventions, ordering and review of laboratory studies, ordering and review of radiographic studies, pulse oximetry and re-evaluation of patient's condition.  RADIOLOGY Images were viewed by me  CT renal protocol IMPRESSION: 1. Pneumoperitoneum, apparently from perforation in the mid sigmoid colon region. A small amount of fluid as well as mesenteric stranding is noted in this area, presumably secondary to diverticulitis. No well-defined abscess evident.  2. No appreciable bowel obstruction. Small hiatal hernia noted.  3. Nonobstructing calculi in each kidney, more on the right than on the left. No ureteral calculus or hydronephrosis on either side. There are occasional prostatic calculi.  4. There is aortic atherosclerosis.  Critical Value/emergent results were called by telephone at the time of interpretation on 06/01/2018 at 8:42 am to Dr. Daryel November , who verbally acknowledged these results. ____________________________________________   DIFFERENTIAL DIAGNOSIS   Renal colic, UTI, pyelonephritis, diverticulitis  FINAL ASSESSMENT AND PLAN  Flank pain, pneumoperitoneum, sigmoid diverticulitis   Plan: The patient had presented for flank pain and was also noted to be febrile. Patient's labs did reveal leukocytosis. Patient's imaging unfortunately revealed pneumoperitoneum from likely perforated sigmoid diverticulum.   I have ordered for IV Zosyn.  I will discuss with general surgery for emergent evaluation and likely operative repair.   Ulice Dash, MD    Note: This note was generated in part or whole with voice recognition software. Voice recognition is usually quite accurate but there are transcription errors that can and very often do occur. I apologize for any typographical errors that were not detected and corrected.     Emily Filbert, MD 06/01/18 [phone]

## 2018-06-02 ENCOUNTER — Other Ambulatory Visit: Payer: Self-pay

## 2018-06-02 LAB — COMPREHENSIVE METABOLIC PANEL
ALK PHOS: 94 U/L (ref 38–126)
ALT: 90 U/L — ABNORMAL HIGH (ref 0–44)
AST: 41 U/L (ref 15–41)
Albumin: 2.7 g/dL — ABNORMAL LOW (ref 3.5–5.0)
Anion gap: 6 (ref 5–15)
BUN: 18 mg/dL (ref 6–20)
CO2: 28 mmol/L (ref 22–32)
Calcium: 7.5 mg/dL — ABNORMAL LOW (ref 8.9–10.3)
Chloride: 101 mmol/L (ref 98–111)
Creatinine, Ser: 1.07 mg/dL (ref 0.61–1.24)
GFR calc Af Amer: >60 mL/min (ref >60)
GFR calc non Af Amer: >60 mL/min (ref >60)
Glucose, Bld: 240 mg/dL — ABNORMAL HIGH (ref 70–99)
Potassium: 3.9 mmol/L (ref 3.5–5.1)
SODIUM: 135 mmol/L (ref 135–145)
Total Bilirubin: 1.4 mg/dL — ABNORMAL HIGH (ref 0.3–1.2)
Total Protein: 5.8 g/dL — ABNORMAL LOW (ref 6.5–8.1)

## 2018-06-02 LAB — CBC
HCT: 41 % (ref 39.0–52.0)
HEMOGLOBIN: 13.8 g/dL (ref 13.0–17.0)
MCH: 30.6 pg (ref 26.0–34.0)
MCHC: 33.7 g/dL (ref 30.0–36.0)
MCV: 90.9 fL (ref 80.0–100.0)
Platelets: 165 10*3/uL (ref 150–400)
RBC: 4.51 MIL/uL (ref 4.22–5.81)
RDW: 12.1 % (ref 11.5–15.5)
WBC: 10.9 10*3/uL — ABNORMAL HIGH (ref 4.0–10.5)
nRBC: 0 % (ref 0.0–0.2)

## 2018-06-02 LAB — PHOSPHORUS: Phosphorus: 3.5 mg/dL (ref 2.5–4.6)

## 2018-06-02 LAB — MAGNESIUM: Magnesium: 1.6 mg/dL — ABNORMAL LOW (ref 1.7–2.4)

## 2018-06-02 MED ORDER — MAGNESIUM SULFATE 2 GM/50ML IV SOLN
2.0000 g | Freq: Once | INTRAVENOUS | Status: AC
  Administered 2018-06-02: 2 g via INTRAVENOUS
  Filled 2018-06-02: qty 50

## 2018-06-02 NOTE — Progress Notes (Addendum)
[facility] SURGICAL PROGRESS NOTE (cpt [phone])  Hospital Day(s): 1.   Post op day(s):  Marland Kitchen   Interval History: Patient seen and examined, no acute events or new complaints overnight. Patient reports he is feeling better but still having LLQ abdominal pain which is mildly improved. Intermittent fevers. No complaints of chills, nausea, emesis, CP, SOB, or bladder changes. He does endorse passing gas. Has remained NPO.  Tested positive for Influenza A and started on Tamiflu last night.  Review of Systems:  Constitutional: + fever, denied chills  Respiratory: denies any shortness of breath  Cardiovascular: denies chest pain or palpitations  Gastrointestinal: + abdominal pain, denied N/V, or diarrhea/and bowel function as per interval history Genitourinary: denies burning with urination or urinary frequency   Vital signs in last 24 hours: [min-max] current  Temp:  [98.6 F (37 C)-101.2 F (38.4 C)] 99.6 F (37.6 C) (02/26 0848) Pulse Rate:  [90-102] 90 (02/26 0848) Resp:  [18-24] 24 (02/26 0848) BP: (125-147)/(72-92) 140/72 (02/26 0848) SpO2:  [90 %-96 %] 93 % (02/26 0848)     Height: 5\' 8"  (172.7 cm) Weight: 97.5 kg BMI (Calculated): 32.7   Intake/Output this shift:  Total I/O In: 336.8 [I.V.:323.3; IV Piggyback:13.6] Out: -      Physical Exam:  Constitutional: alert, cooperative and no distress  HENT: normocephalic without obvious abnormality  Eyes: EOM's grossly intact and symmetric  Respiratory: breathing non-labored at rest  Gastrointestinal: soft, LLQ tenderness, mild distension, no rebound/guarding Musculoskeletal: no edema or wounds, motor and sensation grossly intact, NT    Labs:  CBC Latest Ref Rng & Units 06/02/2018 06/01/2018 09/03/2014  WBC 4.0 - 10.5 K/uL 10.9(H) 14.4(H) 9.6  Hemoglobin 13.0 - 17.0 g/dL 74.1 28.7 86.7  Hematocrit 39.0 - 52.0 % 41.0 49.5 47.3  Platelets 150 - 400 K/uL 165 278 208   CMP Latest Ref Rng & Units 06/02/2018 06/01/2018  09/03/2014  Glucose 70 - 99 mg/dL 672(C) 947(S) 962(E)  BUN 6 - 20 mg/dL 18 36(O) 29(U)  Creatinine 0.61 - 1.24 mg/dL 7.65 4.65 0.35(W)  Sodium 135 - 145 mmol/L 135 131(L) 141  Potassium 3.5 - 5.1 mmol/L 3.9 4.0 4.0  Chloride 98 - 111 mmol/L 101 95(L) 104  CO2 22 - 32 mmol/L 28 24 27   Calcium 8.9 - 10.3 mg/dL 7.5(L) 8.9 9.1  Total Protein 6.5 - 8.1 g/dL 6.5(K) 7.7 8.1  Total Bilirubin 0.3 - 1.2 mg/dL 8.1(E) 1.0 0.5  Alkaline Phos 38 - 126 U/L 94 145(H) 59  AST 15 - 41 U/L 41 88(H) 92(H)  ALT 0 - 44 U/L 90(H) 159(H) 141(H)     Imaging studies: No new pertinent imaging studies   Assessment/Plan: (ICD-10's: K74.20) 53 y.o. male who is influenza A positive with hypomagnesemia but otherwise improved leukocytosis and abdominal pain as well as scattered areas pneumoperitoneum on imaging studies most likely consistent with perforated diverticulitis which is complicated by 3 days of methylprednisolone use as well as by pertinent comorbidities including obesity.   - Remain NPO for now  - Continue IVF, IV ABx (Zosyn)  - Monitor abdominal examination, vitals, on-going bowel function  - Trend leukocytosis  - Replete magnesium, monitor   - started on tamiflu  - Again, will attempt conservative management and patient understands if he clinically worsens or does not improve he will require surgical intervention and likely a temporary colostomy which he understands.   - Medical management of comorbidities              -  DVT prophylaxis   All of the above findings and recommendations were discussed with the patient, and the medical team, and all of patient's questions were answered to his expressed satisfaction.  -- Lynden Oxford, PA-C [facility] 06/02/2018, 9:22 AM [phone] M-F: 7am - 4pm  I saw and evaluated the patient.  I agree with the above documentation, exam, and plan, which I have edited where appropriate. Duanne Guess  5:09 PM

## 2018-06-03 LAB — BASIC METABOLIC PANEL
Anion gap: 8 (ref 5–15)
BUN: 10 mg/dL (ref 6–20)
CO2: 25 mmol/L (ref 22–32)
Calcium: 7.6 mg/dL — ABNORMAL LOW (ref 8.9–10.3)
Chloride: 100 mmol/L (ref 98–111)
Creatinine, Ser: 0.85 mg/dL (ref 0.61–1.24)
GFR calc Af Amer: >60 mL/min (ref >60)
GFR calc non Af Amer: >60 mL/min (ref >60)
Glucose, Bld: 156 mg/dL — ABNORMAL HIGH (ref 70–99)
Potassium: 3.8 mmol/L (ref 3.5–5.1)
Sodium: 133 mmol/L — ABNORMAL LOW (ref 135–145)

## 2018-06-03 LAB — CBC
HCT: 40.3 % (ref 39.0–52.0)
Hemoglobin: 13.9 g/dL (ref 13.0–17.0)
MCH: 31 pg (ref 26.0–34.0)
MCHC: 34.5 g/dL (ref 30.0–36.0)
MCV: 90 fL (ref 80.0–100.0)
NRBC: 0 % (ref 0.0–0.2)
Platelets: 158 10*3/uL (ref 150–400)
RBC: 4.48 MIL/uL (ref 4.22–5.81)
RDW: 11.7 % (ref 11.5–15.5)
WBC: 11.2 10*3/uL — ABNORMAL HIGH (ref 4.0–10.5)

## 2018-06-03 LAB — HIV ANTIBODY (ROUTINE TESTING W REFLEX): HIV Screen 4th Generation wRfx: NONREACTIVE

## 2018-06-03 LAB — MAGNESIUM: Magnesium: 2.1 mg/dL (ref 1.7–2.4)

## 2018-06-03 NOTE — Progress Notes (Addendum)
[facility] SURGICAL PROGRESS NOTE (cpt [phone])  Hospital Day(s): 2.   Post op day(s):  Marland Kitchen   Interval History: Patient seen and examined, no acute events or new complaints overnight. Patient reports improvement in his LLQ abdominal pain. It is still in the LLQ however is no longer constant. Did report a mild fever of 100.5 in last 24 hours. No complaints of chills, nausea, or emesis. He has tolerated clear liquids. Passing flatus and having BM.  Review of Systems:  Constitutional: denies fever, chills Respiratory: denies any shortness of breath  Cardiovascular: denies chest pain or palpitations  Gastrointestinal: +abdominal pain, denied N/V, or diarrhea/and bowel function as per interval history Genitourinary: denies burning with urination or urinary frequency   Vital signs in last 24 hours: [min-max] current  Temp:  [99.4 F (37.4 C)-100.5 F (38.1 C)] 99.6 F (37.6 C) (02/27 0554) Pulse Rate:  [65-90] 65 (02/27 0554) Resp:  [20-24] 20 (02/27 0554) BP: (137-147)/(85-89) 147/85 (02/27 0554) SpO2:  [92 %-94 %] 94 % (02/27 0554)     Height: 5\' 8"  (172.7 cm) Weight: 97.5 kg BMI (Calculated): 32.7   Intake/Output this shift:  No intake/output data recorded.    Physical Exam:  Constitutional: alert, cooperative and no distress  HENT: normocephalic without obvious abnormality  Eyes: EOM's grossly intact and symmetric  Respiratory: breathing non-labored at rest  Gastrointestinal: soft, LLQ tenderness (improved), mild distension, no rebound/guarding Musculoskeletal: no edema or wounds, motor and sensation grossly intact, NT    Labs:  CBC Latest Ref Rng & Units 06/03/2018 06/02/2018 06/01/2018  WBC 4.0 - 10.5 K/uL 11.2(H) 10.9(H) 14.4(H)  Hemoglobin 13.0 - 17.0 g/dL 41.4 23.9 53.2  Hematocrit 39.0 - 52.0 % 40.3 41.0 49.5  Platelets 150 - 400 K/uL 158 165 278   CMP Latest Ref Rng & Units 06/03/2018 06/02/2018 06/01/2018  Glucose 70 - 99 mg/dL 023(X) 435(W) 861(U)  BUN  6 - 20 mg/dL 10 18 83(F)  Creatinine 0.61 - 1.24 mg/dL 2.90 2.11 1.55  Sodium 135 - 145 mmol/L 133(L) 135 131(L)  Potassium 3.5 - 5.1 mmol/L 3.8 3.9 4.0  Chloride 98 - 111 mmol/L 100 101 95(L)  CO2 22 - 32 mmol/L 25 28 24   Calcium 8.9 - 10.3 mg/dL 7.6(L) 7.5(L) 8.9  Total Protein 6.5 - 8.1 g/dL - 5.8(L) 7.7  Total Bilirubin 0.3 - 1.2 mg/dL - 2.0(E) 1.0  Alkaline Phos 38 - 126 U/L - 94 145(H)  AST 15 - 41 U/L - 41 88(H)  ALT 0 - 44 U/L - 90(H) 159(H)     Imaging studies: No new pertinent imaging studies   Assessment/Plan: (ICD-10's: K15.20) 53 y.o. male who is influenza A positive with resolved hypomagnesemia but otherwise stableleukocytosis and improved abdominal pain as well as scattered areas pneumoperitoneum on imaging studies most likely consistent with perforated diverticulitis whichis complicated by 3 days of methylprednisolone use as well as by pertinent comorbidities including obesity.   - Continue clears for today, maintenance IVF   - Will re-image with CT Abdomen/Pelvis with IV/PO contrast tomorrow to re-assess for abscess/worsening inflammation/perforation.    - Trend leukocytosis  - Pain control prn, antiemetics prn   - Again, will continue conservative management and patient understands if he clinically worsens or does not improve he will require surgical intervention and likely a temporary colostomy which he understands.              - Medical management of comorbidities - DVT prophylaxis    All of the  above findings and recommendations were discussed with the patient, and the medical team, and all of patient's questions were answered to his expressed satisfaction.  -- Lynden Oxford, PA-C [facility] 06/03/2018, 9:39 AM [phone] M-F: 7am - 4pm  I saw and evaluated the patient.  I agree with the above documentation, exam, and plan, which I have edited where appropriate. Duanne Guess  10:52 AM

## 2018-06-04 ENCOUNTER — Inpatient Hospital Stay: Payer: BLUE CROSS/BLUE SHIELD

## 2018-06-04 LAB — CBC
HCT: 41.4 % (ref 39.0–52.0)
Hemoglobin: 14.3 g/dL (ref 13.0–17.0)
MCH: 30.8 pg (ref 26.0–34.0)
MCHC: 34.5 g/dL (ref 30.0–36.0)
MCV: 89.2 fL (ref 80.0–100.0)
Platelets: 215 10*3/uL (ref 150–400)
RBC: 4.64 MIL/uL (ref 4.22–5.81)
RDW: 11.8 % (ref 11.5–15.5)
WBC: 11 10*3/uL — ABNORMAL HIGH (ref 4.0–10.5)
nRBC: 0 % (ref 0.0–0.2)

## 2018-06-04 LAB — MAGNESIUM: MAGNESIUM: 1.9 mg/dL (ref 1.7–2.4)

## 2018-06-04 LAB — BASIC METABOLIC PANEL
Anion gap: 10 (ref 5–15)
BUN: 8 mg/dL (ref 6–20)
CO2: 24 mmol/L (ref 22–32)
Calcium: 7.9 mg/dL — ABNORMAL LOW (ref 8.9–10.3)
Chloride: 98 mmol/L (ref 98–111)
Creatinine, Ser: 0.79 mg/dL (ref 0.61–1.24)
GFR calc Af Amer: >60 mL/min (ref >60)
GFR calc non Af Amer: >60 mL/min (ref >60)
Glucose, Bld: 130 mg/dL — ABNORMAL HIGH (ref 70–99)
Potassium: 3.6 mmol/L (ref 3.5–5.1)
Sodium: 132 mmol/L — ABNORMAL LOW (ref 135–145)

## 2018-06-04 LAB — PHOSPHORUS: Phosphorus: 2.4 mg/dL — ABNORMAL LOW (ref 2.5–4.6)

## 2018-06-04 MED ORDER — MIDAZOLAM HCL 2 MG/2ML IJ SOLN
INTRAMUSCULAR | Status: AC | PRN
  Administered 2018-06-04 (×2): 1 mg via INTRAVENOUS

## 2018-06-04 MED ORDER — IOHEXOL 300 MG/ML  SOLN
100.0000 mL | Freq: Once | INTRAMUSCULAR | Status: AC | PRN
  Administered 2018-06-04: 100 mL via INTRAVENOUS

## 2018-06-04 MED ORDER — FENTANYL CITRATE (PF) 100 MCG/2ML IJ SOLN
INTRAMUSCULAR | Status: AC | PRN
  Administered 2018-06-04: 25 ug via INTRAVENOUS
  Administered 2018-06-04: 50 ug via INTRAVENOUS

## 2018-06-04 MED ORDER — MIDAZOLAM HCL 2 MG/2ML IJ SOLN
INTRAMUSCULAR | Status: AC
  Filled 2018-06-04: qty 2

## 2018-06-04 MED ORDER — IOPAMIDOL (ISOVUE-300) INJECTION 61%
15.0000 mL | INTRAVENOUS | Status: AC
  Administered 2018-06-04: 15 mL via ORAL

## 2018-06-04 MED ORDER — FENTANYL CITRATE (PF) 100 MCG/2ML IJ SOLN
INTRAMUSCULAR | Status: AC
  Filled 2018-06-04: qty 4

## 2018-06-04 MED ORDER — BUTALBITAL-APAP-CAFFEINE 50-325-40 MG PO TABS
1.0000 | ORAL_TABLET | Freq: Once | ORAL | Status: AC
  Administered 2018-06-04: 1 via ORAL
  Filled 2018-06-04: qty 1

## 2018-06-04 MED ORDER — POTASSIUM PHOSPHATE MONOBASIC 500 MG PO TABS
500.0000 mg | ORAL_TABLET | Freq: Two times a day (BID) | ORAL | Status: AC
  Administered 2018-06-04 (×2): 500 mg via ORAL
  Filled 2018-06-04 (×2): qty 1

## 2018-06-04 NOTE — Progress Notes (Addendum)
[facility] SURGICAL PROGRESS NOTE (cpt [phone])  Hospital Day(s): 3.   Post op day(s):  Jeffrey Boyle   Interval History: Patient seen and examined, no acute events or new complaints overnight. Patient reports that he continues to have LLQ pain however it seems improved. He denies any associated fevers,. Chills, nausea, emesis, or additional symptoms. He has tolerated clear liquids. He is passing flatus and having BMs.  Review of Systems:  Constitutional: denies fever, chills Respiratory: denies any shortness of breath  Cardiovascular: denies chest pain or palpitations  Gastrointestinal: +abdominal pain (LLQ), denied N/V, or diarrhea/and bowel function as per interval history Genitourinary: denies burning with urination or urinary frequency   Vital signs in last 24 hours: [min-max] current  Temp:  [98.6 F (37 C)-100.2 F (37.9 C)] 98.6 F (37 C) (02/28 0820) Pulse Rate:  [69-88] 69 (02/28 0820) Resp:  [20-21] 20 (02/28 0820) BP: (113-152)/(53-95) 113/53 (02/28 0820) SpO2:  [94 %-96 %] 96 % (02/28 0820)     Height: 5\' 8"  (172.7 cm) Weight: 97.5 kg BMI (Calculated): 32.7   Intake/Output this shift:  Total I/O In: 175.6 [I.V.:175.6] Out: -    Intake/Output last 2 shifts:  @IOLAST2SHIFTS @   Physical Exam:  Constitutional: alert, cooperative and no distress  HENT: normocephalic without obvious abnormality  Eyes: EOM's grossly intact and symmetric  Respiratory: breathing non-labored at rest  Gastrointestinal: soft,LLQ tenderness (improved),mild distension (improved), no rebound/guarding Musculoskeletal: no edema or wounds, motor and sensation grossly intact, NT    Labs:  CBC Latest Ref Rng & Units 06/04/2018 06/03/2018 06/02/2018  WBC 4.0 - 10.5 K/uL 11.0(H) 11.2(H) 10.9(H)  Hemoglobin 13.0 - 17.0 g/dL 10.9 32.3 55.7  Hematocrit 39.0 - 52.0 % 41.4 40.3 41.0  Platelets 150 - 400 K/uL 215 158 165   CMP Latest Ref Rng & Units 06/04/2018 06/03/2018 06/02/2018  Glucose 70 -  99 mg/dL 322(G) 254(Y) 706(C)  BUN 6 - 20 mg/dL 8 10 18   Creatinine 0.61 - 1.24 mg/dL 3.76 2.83 1.51  Sodium 135 - 145 mmol/L 132(L) 133(L) 135  Potassium 3.5 - 5.1 mmol/L 3.6 3.8 3.9  Chloride 98 - 111 mmol/L 98 100 101  CO2 22 - 32 mmol/L 24 25 28   Calcium 8.9 - 10.3 mg/dL 7.9(L) 7.6(L) 7.5(L)  Total Protein 6.5 - 8.1 g/dL - - 5.8(L)  Total Bilirubin 0.3 - 1.2 mg/dL - - 1.4(H)  Alkaline Phos 38 - 126 U/L - - 94  AST 15 - 41 U/L - - 41  ALT 0 - 44 U/L - - 90(H)     Imaging studies:   CT Abdomen/Pelvis (06/04/2018) personally reviewed with Dr Lady Gary and radiologist report reviewed:  IMPRESSION: 1) Perforated sigmoid diverticulitis, with persistent pneumoperitoneum demonstrated. 2) Increased severity of moderate sigmoid diverticulitis since prior exam. Multiple small pericolonic fluid and gas collections in the pelvis measuring up to 3 cm, suspicious for developing abscesses.   Assessment/Plan: (ICD-10's: K57.20) 53 y.o.malewho is influenza A positivebut otherwise stable but with persistent leukocytosis (11.0)and improved abdominal painas well asscattered areaspneumoperitoneum on imaging studies most likely consistent with perforated diverticulitis,complicated by 3 days of methylprednisolone use as well as bypertinent comorbidities including obesity.   - NPO, IVF. IV ABx (Zosyn)  - Trend leukocytosis, replace electrolytes             - Pain control prn, antiemetics prn   - Will discuss with ID regarding current ABx regimen             - Again, will  continue conservative management and patient understands if he clinically worsens or does not improve he will require surgical intervention and likely a temporarycolostomy which he understands.  - Medical management of comorbidities - DVT prophylaxis   All of the above findings and recommendations were discussed with the patient, and the medical team, and all of patient's questions were answered to his  expressed satisfaction.  -- Lynden Oxford, PA-C [facility] 06/04/2018, 11:15 AM [phone] M-F: 7am - 4pm   Addendum: CT scan suggested area of abscess.  Arrange for aspiration (too small for drain).  I saw and evaluated the patient.  I agree with the above documentation, exam, and plan, which I have edited where appropriate. Duanne Guess  9:42 AM

## 2018-06-04 NOTE — Procedures (Signed)
Small pelvic diverticular abscess  S/p CT ASPIRATION  3CC PUS ASPIRATED NO COMP STABLE EBL 0 CX SENT FULL REPORT IN PACS

## 2018-06-04 NOTE — Plan of Care (Signed)
The patient had fluid aspiration from his abdomen today. Recovering well. No falls. The patient is currently NPO. Fioricet given for a headache.  Problem: Education: Goal: Knowledge of General Education information will improve Description Including pain rating scale, medication(s)/side effects and non-pharmacologic comfort measures Outcome: Progressing   Problem: Health Behavior/Discharge Planning: Goal: Ability to manage health-related needs will improve Outcome: Progressing   Problem: Clinical Measurements: Goal: Ability to maintain clinical measurements within normal limits will improve Outcome: Progressing Goal: Will remain free from infection Outcome: Progressing Goal: Diagnostic test results will improve Outcome: Progressing Goal: Respiratory complications will improve Outcome: Progressing Goal: Cardiovascular complication will be avoided Outcome: Progressing   Problem: Activity: Goal: Risk for activity intolerance will decrease Outcome: Progressing   Problem: Nutrition: Goal: Adequate nutrition will be maintained Outcome: Progressing   Problem: Coping: Goal: Level of anxiety will decrease Outcome: Progressing   Problem: Elimination: Goal: Will not experience complications related to bowel motility Outcome: Progressing Goal: Will not experience complications related to urinary retention Outcome: Progressing   Problem: Pain Managment: Goal: General experience of comfort will improve Outcome: Progressing   Problem: Safety: Goal: Ability to remain free from injury will improve Outcome: Progressing   Problem: Skin Integrity: Goal: Risk for impaired skin integrity will decrease Outcome: Progressing

## 2018-06-04 NOTE — Consult Note (Signed)
Chief Complaint: Patient was seen in consultation today for acute, perforated diverticulitis  Referring Physician(s): Dr. Lady Gary  Supervising Physician: Ruel Favors  Patient Status: [facility] - In-pt  History of Present Illness: Jeffrey Boyle is a 53 y.o. male with history of kidney stones and prior appendectomy who presented to [facility] ED 2/25 for acute abdominal pain.  Patient found to have perforated diverticulitis. He was started on antibiotics for conservative management. Repeat imaging today shows worsening diverticulitis.   Repeat CT Abdomen/Pelvis: Perforated sigmoid diverticulitis, with persistent pneumoperitoneum demonstrated.  Increased severity of moderate sigmoid diverticulitis since prior exam. Multiple small pericolonic fluid and gas collections in the pelvis measuring up to 3 cm, suspicious for developing abscesses.  IR consulted for aspiration and drainage of intra-abdominal fluid collection at the request of Dr. Lady Gary.  Patient has been NPO today.  Last dose lovenox was 10PM 2/27  History reviewed. No pertinent past medical history.  Past Surgical History:  Procedure Laterality Date  . PILONIDAL CYST / SINUS EXCISION      Allergies: Patient has no known allergies.  Medications: Prior to Admission medications   Medication Sig Start Date End Date Taking? Authorizing Provider  benzonatate (TESSALON) 200 MG capsule Take 200 mg by mouth 3 (three) times daily. 05/29/18  Yes [provider]  methylPREDNISolone (MEDROL DOSEPAK) 4 MG TBPK tablet Take 4 mg by mouth as directed. 05/29/18  Yes [provider]  ibuprofen (ADVIL,MOTRIN) 800 MG tablet Take 1 tablet (800 mg total) by mouth every 8 (eight) hours as needed for moderate pain. Patient not taking: Reported on 06/01/2018 09/04/14   Irean Hong, MD  ondansetron (ZOFRAN) 4 MG tablet Take 1 tablet (4 mg total) by mouth every 8 (eight) hours as needed for nausea or vomiting. Patient not taking:  Reported on 06/01/2018 09/04/14   Irean Hong, MD  oxyCODONE-acetaminophen (ROXICET) 5-325 MG per tablet Take 1 tablet by mouth every 4 (four) hours as needed for severe pain. Patient not taking: Reported on 06/01/2018 09/04/14   Irean Hong, MD  tamsulosin (FLOMAX) 0.4 MG CAPS capsule Take 1 capsule (0.4 mg total) by mouth once. Patient not taking: Reported on 06/01/2018 09/04/14   Irean Hong, MD     History reviewed. No pertinent family history.  Social History   Socioeconomic History  . Marital status: Single    Spouse name: Not on file  . Number of children: Not on file  . Years of education: Not on file  . Highest education level: Not on file  Occupational History  . Not on file  Social Needs  . Financial resource strain: Not on file  . Food insecurity:    Worry: Not on file    Inability: Not on file  . Transportation needs:    Medical: Not on file    Non-medical: Not on file  Tobacco Use  . Smoking status: Never Smoker  . Smokeless tobacco: Never Used  Substance and Sexual Activity  . Alcohol use: Yes  . Drug use: Not on file  . Sexual activity: Not on file  Lifestyle  . Physical activity:    Days per week: Not on file    Minutes per session: Not on file  . Stress: Not on file  Relationships  . Social connections:    Talks on phone: Not on file    Gets together: Not on file    Attends religious service: Not on file    Active member of club or organization:  Not on file    Attends meetings of clubs or organizations: Not on file    Relationship status: Not on file  Other Topics Concern  . Not on file  Social History Narrative  . Not on file     Review of Systems: A 12 point ROS discussed and pertinent positives are indicated in the HPI above.  All other systems are negative.  Review of Systems  Constitutional: Negative for fatigue and fever.  Respiratory: Negative for cough and shortness of breath.   Cardiovascular: Negative for chest pain.    Gastrointestinal: Positive for abdominal pain (LLQ). Negative for constipation, nausea and vomiting.  Genitourinary: Negative for dysuria, frequency and urgency.  Musculoskeletal: Negative for back pain.  Psychiatric/Behavioral: Negative for behavioral problems and confusion.    Vital Signs: BP (!) 113/53 (BP Location: Left Arm)   Pulse 69   Temp 98.6 F (37 C) (Oral)   Resp 20   Ht  (1.727 m)   Wt 215 lb (97.5 kg)   SpO2 96%   BMI 32.69 kg/m   Physical Exam Vitals signs and nursing note reviewed.  Constitutional:      General: He is not in acute distress.    Appearance: He is well-developed.  Cardiovascular:     Rate and Rhythm: Normal rate and regular rhythm.  Pulmonary:     Effort: Pulmonary effort is normal. No respiratory distress.     Breath sounds: Normal breath sounds.  Abdominal:     General: Abdomen is flat. There is no distension.     Tenderness: There is abdominal tenderness in the left lower quadrant. There is no guarding.  Skin:    General: Skin is warm and dry.  Neurological:     General: No focal deficit present.     Mental Status: He is alert and oriented to person, place, and time.  Psychiatric:        Mood and Affect: Mood normal.        Behavior: Behavior normal.      MD Evaluation Airway: WNL Heart: WNL Abdomen: WNL Chest/ Lungs: WNL ASA  Classification: 3 Mallampati/Airway Score: Two   Imaging: Ct Abdomen Pelvis W Contrast  Result Date: 06/04/2018 CLINICAL DATA:  Follow-up perforated diverticulitis. Abdominal pain and fever. Evaluate for abscess. EXAM: CT ABDOMEN AND PELVIS WITH CONTRAST TECHNIQUE: Multidetector CT imaging of the abdomen and pelvis was performed using the standard protocol following bolus administration of intravenous contrast. CONTRAST:  OMNIPAQUE IOHEXOL 300 MG/ML  SOLN COMPARISON:  Noncontrast CT on 06/01/2018 FINDINGS: Lower Chest: No acute findings. Hepatobiliary: No hepatic masses identified.  Mild-to-moderate diffuse hepatic steatosis again noted. Gallbladder is unremarkable. Pancreas:  No mass or inflammatory changes. Spleen: Within normal limits in size and appearance. Adrenals/Urinary Tract: No masses identified. Several renal calculi are seen bilaterally, largest in the right kidney measuring 8 mm. No evidence of ureteral calculi or hydronephrosis. Unremarkable unopacified urinary bladder. Stomach/Bowel: Moderate sigmoid diverticulitis has significantly increased since previous study. Pneumoperitoneum is again demonstrated. Increased pericolonic inflammatory changes are seen in the pelvis with secondary involvement of adjacent distal ileum. No evidence of small bowel obstruction. Several new small pericolonic fluid and gas collections are seen adjacent to the sigmoid colon, largest measuring 3.0 cm in diameter, suspicious for developing abscess. Vascular/Lymphatic: No pathologically enlarged lymph nodes. No abdominal aortic aneurysm. Reproductive:  No mass or other significant abnormality. Other:  None. Musculoskeletal:  No suspicious bone lesions identified. IMPRESSION: Perforated sigmoid diverticulitis, with persistent pneumoperitoneum demonstrated. Increased  severity of moderate sigmoid diverticulitis since prior exam. Multiple small pericolonic fluid and gas collections in the pelvis measuring up to 3 cm, suspicious for developing abscesses. Incidentally noted bilateral nephrolithiasis and hepatic steatosis. Electronically Signed   By: Myles Rosenthal M.D.   On: 06/04/2018 11:34   Ct Renal Stone Study  Result Date: 06/01/2018 CLINICAL DATA:  Lower abdominal pain, primarily left-sided EXAM: CT ABDOMEN AND PELVIS WITHOUT CONTRAST TECHNIQUE: Multidetector CT imaging of the abdomen and pelvis was performed following the standard protocol without oral or IV contrast. COMPARISON:  Sep 03, 2014 FINDINGS: Lower chest: Lung bases are clear.  There is a small hiatal hernia. Hepatobiliary: No focal liver  lesions are apparent on this noncontrast enhanced study. Gallbladder wall is not appreciably thickened. There is no biliary duct dilatation. Pancreas: No pancreatic mass or inflammatory focus. Spleen: No splenic lesions are evident. Adrenals/Urinary Tract: Adrenals appear unremarkable bilaterally. Kidneys bilaterally show no evident mass or hydronephrosis on either side. There is a calculus in the mid right kidney measuring 8 x 8 mm. There are scattered 1-2 mm calculi elsewhere in the right kidney. There is a 2 mm calculus in the mid left kidney with several tiny calculi elsewhere in the left kidney. There is no appreciable ureteral calculus on either side. Urinary bladder is midline with wall thickness within normal limits. Stomach/Bowel: There are multiple sigmoid diverticula. There is stranding focally in the mid sigmoid colon consistent with a degree of diverticulitis. Note that there is evidence of perforation in this area with extraluminal air and fluid. Free air is seen scattered throughout the abdomen and pelvis. No well-defined abscess is seen. No bowel obstruction is evident. Vascular/Lymphatic: There is no abdominal aortic aneurysm. There is aortic atherosclerosis. No evident adenopathy in the abdomen or pelvis. Reproductive: There are occasional prostatic calculi. Prostate and seminal vesicles are normal in size and contour. There is no pelvic mass evident. Other: There is no periappendiceal region inflammation. Appendix not well seen. There is no abscess or ascites in the abdomen or pelvis. Musculoskeletal: No blastic or lytic bone lesions. No intramuscular or abdominal wall lesions are evident. IMPRESSION: 1. Pneumoperitoneum, apparently from perforation in the mid sigmoid colon region. A small amount of fluid as well as mesenteric stranding is noted in this area, presumably secondary to diverticulitis. No well-defined abscess evident. 2.  No appreciable bowel obstruction.  Small hiatal hernia noted.  3. Nonobstructing calculi in each kidney, more on the right than on the left. No ureteral calculus or hydronephrosis on either side. There are occasional prostatic calculi. 4.  There is aortic atherosclerosis. Critical Value/emergent results were called by telephone at the time of interpretation on 06/01/2018 at 8:42 am to Dr. Daryel November , who verbally acknowledged these results. Electronically Signed   By: Bretta Bang III M.D.   On: 06/01/2018 08:42    Labs:  CBC: Recent Labs    06/01/18 0807 06/02/18 0454 06/03/18 0556 06/04/18 1005  WBC 14.4* 10.9* 11.2* 11.0*  HGB 16.8 13.8 13.9 14.3  HCT 49.5 41.0 40.3 41.4  PLT 278 165 158 215    COAGS: No results for input(s): INR, APTT in the last 8760 hours.  BMP: Recent Labs    06/01/18 0807 06/02/18 0454 06/03/18 0556 06/04/18 1005  NA 131* 135 133* 132*  K 4.0 3.9 3.8 3.6  CL 95* 101 100 98  CO2 24 28 25 24   GLUCOSE 308* 240* 156* 130*  BUN 25* 18 10 8   CALCIUM 8.9 7.5*  7.6* 7.9*  CREATININE 1.18 1.07 0.85 0.79  GFRNONAA >60 >60 >60 >60  GFRAA >60 >60 >60 >60    LIVER FUNCTION TESTS: Recent Labs    06/01/18 0807 06/02/18 0454  BILITOT 1.0 1.4*  AST 88* 41  ALT 159* 90*  ALKPHOS 145* 94  PROT 7.7 5.8*  ALBUMIN 3.7 2.7*    TUMOR MARKERS: No results for input(s): AFPTM, CEA, CA199, CHROMGRNA in the last 8760 hours.  Assessment and Plan: Acute, perforated diverticulitis Patient admitted with abdominal pain is found to have perforated diverticulitis.  After 3 days of conservative management, repeat imaging today shows potential worsening of his diverticulitis despite improved abdominal pain, WBC (14K on admission  11K today), and remaining afebrile.  IR consulted for aspiration vs. Drainage.  Case reviewed by Dr. Miles Costain who approves patient for aspiration, possible drain placement.   Risks and benefits discussed with the patient including bleeding, infection, damage to adjacent structures, bowel  perforation/fistula connection, and sepsis.  All of the patient's questions were answered, patient is agreeable to proceed. Consent signed and in chart.  Thank you for this interesting consult.  I greatly enjoyed meeting Jeffrey Boyle and look forward to participating in their care.  A copy of this report was sent to the requesting provider on this date.  Electronically Signed: Hoyt Koch, PA 06/04/2018, 2:14 PM   I spent a total of 40 Minutes    in face to face in clinical consultation, greater than 50% of which was counseling/coordinating care for perforated diverticulitis.

## 2018-06-05 LAB — COMPREHENSIVE METABOLIC PANEL
ALBUMIN: 2.6 g/dL — AB (ref 3.5–5.0)
ALT: 57 U/L — ABNORMAL HIGH (ref 0–44)
AST: 48 U/L — ABNORMAL HIGH (ref 15–41)
Alkaline Phosphatase: 100 U/L (ref 38–126)
Anion gap: 11 (ref 5–15)
BUN: 10 mg/dL (ref 6–20)
CO2: 23 mmol/L (ref 22–32)
Calcium: 8.1 mg/dL — ABNORMAL LOW (ref 8.9–10.3)
Chloride: 101 mmol/L (ref 98–111)
Creatinine, Ser: 0.86 mg/dL (ref 0.61–1.24)
GFR calc Af Amer: >60 mL/min (ref >60)
GFR calc non Af Amer: >60 mL/min (ref >60)
Glucose, Bld: 101 mg/dL — ABNORMAL HIGH (ref 70–99)
Potassium: 3.5 mmol/L (ref 3.5–5.1)
Sodium: 135 mmol/L (ref 135–145)
Total Bilirubin: 1.6 mg/dL — ABNORMAL HIGH (ref 0.3–1.2)
Total Protein: 6.5 g/dL (ref 6.5–8.1)

## 2018-06-05 LAB — CBC
HCT: 42.2 % (ref 39.0–52.0)
Hemoglobin: 14.3 g/dL (ref 13.0–17.0)
MCH: 30.5 pg (ref 26.0–34.0)
MCHC: 33.9 g/dL (ref 30.0–36.0)
MCV: 90 fL (ref 80.0–100.0)
Platelets: 244 10*3/uL (ref 150–400)
RBC: 4.69 MIL/uL (ref 4.22–5.81)
RDW: 11.9 % (ref 11.5–15.5)
WBC: 9.2 10*3/uL (ref 4.0–10.5)
nRBC: 0 % (ref 0.0–0.2)

## 2018-06-05 MED ORDER — ACETAMINOPHEN 325 MG PO TABS
650.0000 mg | ORAL_TABLET | ORAL | Status: DC | PRN
  Administered 2018-06-05 – 2018-06-07 (×4): 650 mg via ORAL
  Filled 2018-06-05 (×4): qty 2

## 2018-06-05 MED ORDER — OXYCODONE HCL 5 MG PO TABS
5.0000 mg | ORAL_TABLET | ORAL | Status: DC | PRN
  Filled 2018-06-05: qty 1

## 2018-06-05 NOTE — Progress Notes (Signed)
CC: Diverticulitis Subjective: Doing much better.  Some mild left lower quadrant pain.  No fevers or chills.  CT scan personally reviewed showing evidence of perforated diverticulitis wall foci of air..  Did underwent a small aspiration with only 3 cc of fluid obtained. He is hungry.  Nausea and vomiting. AVSS WBC trending down  Objective: Vital signs in last 24 hours: Temp:  [98.4 F (36.9 C)-98.7 F (37.1 C)] 98.7 F (37.1 C) (02/29 1138) Pulse Rate:  [72-87] 78 (02/29 1138) Resp:  [18-22] 18 (02/29 1138) BP: (141-171)/(95-109) 157/102 (02/29 1138) SpO2:  [94 %-100 %] 94 % (02/29 1138) Last BM Date: 06/03/18  Intake/Output from previous day: 02/28 0701 - 02/29 0700 In: 683.8 [I.V.:565.4; IV Piggyback:118.4] Out: 7 [Urine:7] Intake/Output this shift: No intake/output data recorded.  Physical exam:  NAD, alert in good spirits ABd: soft, mild TTP LLQ no peritonitis Ext: well perfused and no edema Neuro: awake and alert, no motor or sens deficits  Lab Results: CBC  Recent Labs    06/04/18 1005 06/05/18 0345  WBC 11.0* 9.2  HGB 14.3 14.3  HCT 41.4 42.2  PLT 215 244   BMET Recent Labs    06/04/18 1005 06/05/18 0345  NA 132* 135  K 3.6 3.5  CL 98 101  CO2 24 23  GLUCOSE 130* 101*  BUN 8 10  CREATININE 0.79 0.86  CALCIUM 7.9* 8.1*   PT/INR No results for input(s): LABPROT, INR in the last 72 hours. ABG No results for input(s): PHART, HCO3 in the last 72 hours.  Invalid input(s): PCO2, PO2  Studies/Results: Ct Abdomen Pelvis W Contrast  Result Date: 06/04/2018 CLINICAL DATA:  Follow-up perforated diverticulitis. Abdominal pain and fever. Evaluate for abscess. EXAM: CT ABDOMEN AND PELVIS WITH CONTRAST TECHNIQUE: Multidetector CT imaging of the abdomen and pelvis was performed using the standard protocol following bolus administration of intravenous contrast. CONTRAST:  OMNIPAQUE IOHEXOL 300 MG/ML  SOLN COMPARISON:  Noncontrast CT on 06/01/2018  FINDINGS: Lower Chest: No acute findings. Hepatobiliary: No hepatic masses identified. Mild-to-moderate diffuse hepatic steatosis again noted. Gallbladder is unremarkable. Pancreas:  No mass or inflammatory changes. Spleen: Within normal limits in size and appearance. Adrenals/Urinary Tract: No masses identified. Several renal calculi are seen bilaterally, largest in the right kidney measuring 8 mm. No evidence of ureteral calculi or hydronephrosis. Unremarkable unopacified urinary bladder. Stomach/Bowel: Moderate sigmoid diverticulitis has significantly increased since previous study. Pneumoperitoneum is again demonstrated. Increased pericolonic inflammatory changes are seen in the pelvis with secondary involvement of adjacent distal ileum. No evidence of small bowel obstruction. Several new small pericolonic fluid and gas collections are seen adjacent to the sigmoid colon, largest measuring 3.0 cm in diameter, suspicious for developing abscess. Vascular/Lymphatic: No pathologically enlarged lymph nodes. No abdominal aortic aneurysm. Reproductive:  No mass or other significant abnormality. Other:  None. Musculoskeletal:  No suspicious bone lesions identified. IMPRESSION: Perforated sigmoid diverticulitis, with persistent pneumoperitoneum demonstrated. Increased severity of moderate sigmoid diverticulitis since prior exam. Multiple small pericolonic fluid and gas collections in the pelvis measuring up to 3 cm, suspicious for developing abscesses. Incidentally noted bilateral nephrolithiasis and hepatic steatosis. Electronically Signed   By: Myles Rosenthal M.D.   On: 06/04/2018 11:34   Ct Aspiration  Result Date: 06/04/2018 INDICATION: Small anterior pelvic diverticular abscess EXAM: CT ANTERIOR PELVIC DIVERTICULAR ABSCESS NEEDLE ASPIRATION MEDICATIONS: The patient is currently admitted to the hospital and receiving intravenous antibiotics. The antibiotics were administered within an appropriate time frame prior to  the initiation of  the procedure. ANESTHESIA/SEDATION: Fentanyl 75 mcg IV; Versed 2 mg IV Moderate Sedation Time:  7 minutes The patient was continuously monitored during the procedure by the interventional radiology nurse under my direct supervision. COMPLICATIONS: None immediate. PROCEDURE: Informed written consent was obtained from the patient after a thorough discussion of the procedural risks, benefits and alternatives. All questions were addressed. Maximal Sterile Barrier Technique was utilized including caps, mask, sterile gowns, sterile gloves, sterile drape, hand hygiene and skin antiseptic. A timeout was performed prior to the initiation of the procedure. Previous imaging reviewed. Patient positioned supine. Noncontrast localization CT performed. The small 3 cm anterior pelvic abscess was localized. Overlying skin marked for a left anterior oblique approach. Under sterile conditions and local anesthesia, CT guidance utilized to advance an 18 gauge 15 cm access needle to the small anterior pelvic abscess. Needle position confirmed with CT. Syringe aspiration yielded 3 cc purulent fluid. Sample sent for culture. Needle removed. No immediate complication. Patient tolerated the procedure well. IMPRESSION: Successful CT-guided aspiration of the small anterior pelvic abscess. Electronically Signed   By: Judie Petit.  Shick M.D.   On: 06/04/2018 15:13    Anti-infectives: Anti-infectives (From admission, onward)   Start     Dose/Rate Route Frequency Ordered Stop   06/01/18 2245  oseltamivir (TAMIFLU) capsule 75 mg     75 mg Oral 2 times daily 06/01/18 2241 06/06/18 0959   06/01/18 1900  oseltamivir (TAMIFLU) capsule 75 mg  Status:  Discontinued     75 mg Oral 2 times daily 06/01/18 1858 06/01/18 2241   06/01/18 1800  piperacillin-tazobactam (ZOSYN) IVPB 3.375 g     3.375 g 12.5 mL/hr over 240 Minutes Intravenous Every 8 hours 06/01/18 0933     06/01/18 0845  piperacillin-tazobactam (ZOSYN) IVPB 3.375 g      3.375 g 100 mL/hr over 30 Minutes Intravenous  Once 06/01/18 2831 06/01/18 0931      Assessment/Plan: Perforated diverticulitis responsive to medical rx May do clears Continue A/Bs Mobilize No Emergent surgical intervention required He wil benefit from elective colectomy once infection subsides Time spent 35 min w > 50% spent in coordination and counseling of care.    Sterling Big, MD, [facility]  06/05/2018

## 2018-06-06 DIAGNOSIS — K5792 Diverticulitis of intestine, part unspecified, without perforation or abscess without bleeding: Secondary | ICD-10-CM

## 2018-06-06 NOTE — Progress Notes (Signed)
CC: Diverticulitis  Subjective: Feeling better, taking clears. Not too hungry. Ambulated, no other complains  Objective: Vital signs in last 24 hours: Temp:  [98.2 F (36.8 C)-98.6 F (37 C)] 98.2 F (36.8 C) (03/01 1335) Pulse Rate:  [74-86] 84 (03/01 1336) Resp:  [18-20] 20 (03/01 1335) BP: (139-156)/(88-100) 139/99 (03/01 1336) SpO2:  [95 %-97 %] 97 % (03/01 1335) Last BM Date: 06/05/18  Intake/Output from previous day: 02/29 0701 - 03/01 0700 In: 0  Out: 650 [Urine:650] Intake/Output this shift: Total I/O In: -  Out: 625 [Urine:625]  Physical exam: NAD, alert Abd: significant improvement on exam w no tenderness today, no peritonitis Ext: no edema and warm  Lab Results: CBC  Recent Labs    06/04/18 1005 06/05/18 0345  WBC 11.0* 9.2  HGB 14.3 14.3  HCT 41.4 42.2  PLT 215 244   BMET Recent Labs    06/04/18 1005 06/05/18 0345  NA 132* 135  K 3.6 3.5  CL 98 101  CO2 24 23  GLUCOSE 130* 101*  BUN 8 10  CREATININE 0.79 0.86  CALCIUM 7.9* 8.1*   PT/INR No results for input(s): LABPROT, INR in the last 72 hours. ABG No results for input(s): PHART, HCO3 in the last 72 hours.  Invalid input(s): PCO2, PO2  Studies/Results: Ct Aspiration  Result Date: 06/04/2018 INDICATION: Small anterior pelvic diverticular abscess EXAM: CT ANTERIOR PELVIC DIVERTICULAR ABSCESS NEEDLE ASPIRATION MEDICATIONS: The patient is currently admitted to the hospital and receiving intravenous antibiotics. The antibiotics were administered within an appropriate time frame prior to the initiation of the procedure. ANESTHESIA/SEDATION: Fentanyl 75 mcg IV; Versed 2 mg IV Moderate Sedation Time:  7 minutes The patient was continuously monitored during the procedure by the interventional radiology nurse under my direct supervision. COMPLICATIONS: None immediate. PROCEDURE: Informed written consent was obtained from the patient after a thorough discussion of the procedural risks, benefits and  alternatives. All questions were addressed. Maximal Sterile Barrier Technique was utilized including caps, mask, sterile gowns, sterile gloves, sterile drape, hand hygiene and skin antiseptic. A timeout was performed prior to the initiation of the procedure. Previous imaging reviewed. Patient positioned supine. Noncontrast localization CT performed. The small 3 cm anterior pelvic abscess was localized. Overlying skin marked for a left anterior oblique approach. Under sterile conditions and local anesthesia, CT guidance utilized to advance an 18 gauge 15 cm access needle to the small anterior pelvic abscess. Needle position confirmed with CT. Syringe aspiration yielded 3 cc purulent fluid. Sample sent for culture. Needle removed. No immediate complication. Patient tolerated the procedure well. IMPRESSION: Successful CT-guided aspiration of the small anterior pelvic abscess. Electronically Signed   By: Judie Petit.  Shick M.D.   On: 06/04/2018 15:13    Anti-infectives: Anti-infectives (From admission, onward)   Start     Dose/Rate Route Frequency Ordered Stop   06/01/18 2245  oseltamivir (TAMIFLU) capsule 75 mg     75 mg Oral 2 times daily 06/01/18 2241 06/05/18 2036   06/01/18 1900  oseltamivir (TAMIFLU) capsule 75 mg  Status:  Discontinued     75 mg Oral 2 times daily 06/01/18 1858 06/01/18 2241   06/01/18 1800  piperacillin-tazobactam (ZOSYN) IVPB 3.375 g     3.375 g 12.5 mL/hr over 240 Minutes Intravenous Every 8 hours 06/01/18 0933     06/01/18 0845  piperacillin-tazobactam (ZOSYN) IVPB 3.375 g     3.375 g 100 mL/hr over 30 Minutes Intravenous  Once 06/01/18 0842 06/01/18 0931  Assessment/Plan:  Diverticulitis responding to medical rx Advance to full liquids Heplock ivf May DC in 24-48hrs No need for emergent surgical intervention at this time I spent 25 minutes in this encounter w > 50% spent in coordination and counseling.  Sterling Big, MD, [facility]  06/06/2018

## 2018-06-07 LAB — CBC
HCT: 48.3 % (ref 39.0–52.0)
Hemoglobin: 16.3 g/dL (ref 13.0–17.0)
MCH: 30.5 pg (ref 26.0–34.0)
MCHC: 33.7 g/dL (ref 30.0–36.0)
MCV: 90.4 fL (ref 80.0–100.0)
PLATELETS: 345 10*3/uL (ref 150–400)
RBC: 5.34 MIL/uL (ref 4.22–5.81)
RDW: 11.9 % (ref 11.5–15.5)
WBC: 9.7 10*3/uL (ref 4.0–10.5)
nRBC: 0 % (ref 0.0–0.2)

## 2018-06-07 LAB — BASIC METABOLIC PANEL
Anion gap: 9 (ref 5–15)
BUN: 8 mg/dL (ref 6–20)
CO2: 26 mmol/L (ref 22–32)
CREATININE: 0.92 mg/dL (ref 0.61–1.24)
Calcium: 8.7 mg/dL — ABNORMAL LOW (ref 8.9–10.3)
Chloride: 101 mmol/L (ref 98–111)
GFR calc Af Amer: >60 mL/min (ref >60)
GFR calc non Af Amer: >60 mL/min (ref >60)
Glucose, Bld: 124 mg/dL — ABNORMAL HIGH (ref 70–99)
Potassium: 3.8 mmol/L (ref 3.5–5.1)
Sodium: 136 mmol/L (ref 135–145)

## 2018-06-07 MED ORDER — HYDRALAZINE HCL 20 MG/ML IJ SOLN
5.0000 mg | INTRAMUSCULAR | Status: DC | PRN
  Administered 2018-06-07: 5 mg via INTRAVENOUS
  Filled 2018-06-07: qty 1

## 2018-06-07 MED ORDER — OXYCODONE HCL 5 MG PO TABS: 5.0000 mg | ORAL_TABLET | Freq: Four times a day (QID) | ORAL | 0 refills | Status: AC | PRN

## 2018-06-07 MED ORDER — AMOXICILLIN-POT CLAVULANATE 875-125 MG PO TABS: 1.0000 | ORAL_TABLET | Freq: Two times a day (BID) | ORAL | 0 refills | Status: AC

## 2018-06-07 NOTE — Progress Notes (Signed)
Discharge order received. Patient is alert and oriented. Vital signs stable . No signs of acute distress. Discharge instructions given. Patient verbalized understanding. No other issues noted at this time.   

## 2018-06-07 NOTE — Discharge Summary (Addendum)
[facility] SURGICAL DISCHARGE SUMMARY (cpt: [phone])  Patient ID: Jeffrey Boyle MRN: [id number] DOB/AGE: Feb 12, 1966 53 y.o.  Admit date: 06/01/2018 Discharge date: 06/07/2018  Discharge Diagnoses Patient Active Problem List   Diagnosis Date Noted  . Diverticulitis of large intestine with perforation without bleeding 06/01/2018    Consultants Interventional Radiology  Procedures 06/04/2018:  CT Guided Aspiration  HPI: 53 y.o. male presented to [facility] ED 02/25 for abdominal pain. Patient reports the acute onset of sharp LLQ abdominal pain around 4 AM this morning when he was getting up to go to the bathroom. He endorses associated fevers but denied any chills, CP, SOB, nausea, emesis, or bladder/bowel changes. No reports of similar pain in the past. The pain is exacerbated with movement. He has a history of kidney stones and thought this was similar and tried Azo without any change in his pain. Previous abdominal surgeries are positive for appendectomy. Of note, he has been on methylprednisolone 4mg  since Sunday, his last dose being Monday at 12 pm, for a sinus infection. Work up in the ED was concerning for perforated diverticulitis.   Hospital Course: He was admitted to general surgery and managed conservatively. He was found to be positive for influenza A and started on Tamiflu. His WBC and pain initially improved however on HD 3 a repeat imaging study was obtained which was concerning for developing abscess without worsening pneumoperitoneum. He underwent CT guided aspiration with interventional radiology on 02/28 and cultures grew out pan sensitive E coli. He continued to improve with conservative management. On the day of discharge (03/02) he was tolerating a diet, pain resolved, mobilizing, and having bowel function. He is to follow up in 2 weeks with Dr Lady Gary and will be referred to GI for colonoscopy. All of his questions were addressed and answered.   Discharge  Condition: Good   Physical Examination:  Constitutional: Well appearing male, NAD Pulmonary: Normal effort, no respiratory distress Gastrointestinal: Soft, non-tender, non-distended Skin: warm, dry   Allergies as of 06/07/2018   No Known Allergies     Medication List    STOP taking these medications   oxyCODONE-acetaminophen 5-325 MG tablet Commonly known as:  ROXICET     TAKE these medications   amoxicillin-clavulanate 875-125 MG tablet Commonly known as:  AUGMENTIN Take 1 tablet by mouth 2 (two) times daily for 10 days.   benzonatate 200 MG capsule Commonly known as:  TESSALON Take 200 mg by mouth 3 (three) times daily.   ibuprofen 800 MG tablet Commonly known as:  ADVIL,MOTRIN Take 1 tablet (800 mg total) by mouth every 8 (eight) hours as needed for moderate pain.   methylPREDNISolone 4 MG Tbpk tablet Commonly known as:  MEDROL DOSEPAK Take 4 mg by mouth as directed.   ondansetron 4 MG tablet Commonly known as:  ZOFRAN Take 1 tablet (4 mg total) by mouth every 8 (eight) hours as needed for nausea or vomiting.   oxyCODONE 5 MG immediate release tablet Commonly known as:  Oxy IR/ROXICODONE Take 1 tablet (5 mg total) by mouth every 6 (six) hours as needed for severe pain or breakthrough pain.   tamsulosin 0.4 MG Caps capsule Commonly known as:  FLOMAX Take 1 capsule (0.4 mg total) by mouth once.        Follow-up Information    Duanne Guess, MD. Schedule an appointment as soon as possible for a visit in 2 week(s).   Specialty:  General Surgery Why:  perforated diverticulitis Contact information: [address] [place] [address] [phone]        Toney Reil, MD Follow up.   Specialty:  Gastroenterology Why:  referral for colonoscopy. recent hospitalization for diverticulitis Contact information: [address] [place] [address] [phone]            -- Lynden Oxford , PA-C [facility]  06/07/2018, 1:49 PM [phone] M-F: 7am - 4pm  I saw and evaluated the patient.  I agree with the above documentation, exam, and plan, which I have edited where appropriate. Duanne Guess  7:50 PM

## 2018-06-07 NOTE — Care Management (Signed)
Per nursing staff patient has been weaned to RA.  Ambulating independently

## 2018-06-07 NOTE — Discharge Instructions (Signed)

## 2018-06-07 NOTE — Progress Notes (Signed)
Trying to reach PA regarding elevated BP.

## 2018-06-09 LAB — AEROBIC/ANAEROBIC CULTURE W GRAM STAIN (SURGICAL/DEEP WOUND)

## 2018-06-09 LAB — AEROBIC/ANAEROBIC CULTURE (SURGICAL/DEEP WOUND)

## 2018-06-21 ENCOUNTER — Other Ambulatory Visit: Payer: Self-pay

## 2018-06-21 ENCOUNTER — Ambulatory Visit (INDEPENDENT_AMBULATORY_CARE_PROVIDER_SITE_OTHER): Payer: BLUE CROSS/BLUE SHIELD | Admitting: General Surgery

## 2018-06-21 ENCOUNTER — Encounter: Payer: Self-pay | Admitting: General Surgery

## 2018-06-21 VITALS — BP 142/94 | HR 84 | Temp 97.5°F | Ht 68.0 in | Wt 212.2 lb

## 2018-06-21 DIAGNOSIS — K5720 Diverticulitis of large intestine with perforation and abscess without bleeding: Secondary | ICD-10-CM | POA: Diagnosis not present

## 2018-06-21 NOTE — Patient Instructions (Addendum)
Patient will need to return to the office in 6 weeks to discuss surgery.    Call the office with any questions or concerns.

## 2018-06-21 NOTE — Progress Notes (Signed)
Jeffrey Boyle returns to clinic today for follow-up after hospitalization of perforated diverticulitis.  He is a 53 year old man who presented to the emergency department on 01 June 2018.  He had sharp left lower quadrant abdominal pain, accompanied by fevers.  He came to the emergency department and was found to have perforated diverticulitis.  He was treated conservatively with aspiration of a small abscess and IV antibiotics.  He was discharged home on 07 June 2018 with a course of oral antibiotics.  He is here today to discuss surgical planning.  He is scheduled to meet with gastroenterology this coming Thursday in anticipation of colonoscopy preoperatively.  He states that he feels great.  He has not had any fevers or chills.  His appetite is normal.  He has no abdominal pain, but does endorse some pressure in the area.  No diarrhea or constipation.  He has been actively working to control his blood pressure with diet and exercise.  He is seeing his primary care provider for a recheck tomorrow.  Past Medical History:  Diagnosis Date  . Hyperlipidemia   . Hypertension    Past Surgical History:  Procedure Laterality Date  . PILONIDAL CYST / SINUS EXCISION     Family History  Problem Relation Age of Onset  . Hypertension Mother   . Stroke Mother   . Heart disease Brother    Social History   Socioeconomic History  . Marital status: Single    Spouse name: Not on file  . Number of children: 2  . Years of education: Not on file  . Highest education level: Not on file  Occupational History  . Not on file  Social Needs  . Financial resource strain: Not on file  . Food insecurity:    Worry: Not on file    Inability: Not on file  . Transportation needs:    Medical: Not on file    Non-medical: Not on file  Tobacco Use  . Smoking status: Never Smoker  . Smokeless tobacco: Never Used  Substance and Sexual Activity  . Alcohol use: Yes  . Drug use: Never  . Sexual activity: Not on  file  Lifestyle  . Physical activity:    Days per week: Not on file    Minutes per session: Not on file  . Stress: Not on file  Relationships  . Social connections:    Talks on phone: Not on file    Gets together: Not on file    Attends religious service: Not on file    Active member of club or organization: Not on file    Attends meetings of clubs or organizations: Not on file    Relationship status: Not on file  . Intimate partner violence:    Fear of current or ex partner: Not on file    Emotionally abused: Not on file    Physically abused: Not on file    Forced sexual activity: Not on file  Other Topics Concern  . Not on file  Social History Narrative  . Not on file   Current Meds  Medication Sig  . benzonatate (TESSALON) 200 MG capsule Take 200 mg by mouth 3 (three) times daily.  Marland Kitchen ibuprofen (ADVIL,MOTRIN) 800 MG tablet Take 1 tablet (800 mg total) by mouth every 8 (eight) hours as needed for moderate pain.  . methylPREDNISolone (MEDROL DOSEPAK) 4 MG TBPK tablet Take 4 mg by mouth as directed.  . ondansetron (ZOFRAN) 4 MG tablet Take 1 tablet (4 mg  total) by mouth every 8 (eight) hours as needed for nausea or vomiting.  Marland Kitchen oxyCODONE (OXY IR/ROXICODONE) 5 MG immediate release tablet Take 1 tablet (5 mg total) by mouth every 6 (six) hours as needed for severe pain or breakthrough pain.  . tamsulosin (FLOMAX) 0.4 MG CAPS capsule Take 1 capsule (0.4 mg total) by mouth once.   No Known Allergies  Vitals:   06/21/18 0856  BP: (!) 142/94  Pulse: 84  Temp: (!) 97.5 F (36.4 C)  SpO2: 95%   Gen: NAD, A&O x3 Pulm: normal WOB on RA CV: well perfused, normal rate Abd: soft, protuberand, non-tender  No new labs or imaging.  Impression and plan: This is a 53 year old man who presented with perforated diverticulitis.  He did well with a conservative course of treatment.  Due to the perforation and abscess, however, I have recommended that he undergo sigmoid colectomy.  He will  have a colonoscopy performed by gastroenterology.  We will then bring him back to our clinic in about 6 weeks time to discuss surgical planning.  I discussed the operation and its risks with him in detail today.  These risks include, but are not limited to, bleeding, infection, ureteral injury, anastomotic leak, need for colostomy, need for diverting ileostomy, subsequent reoperation to reverse the ostomy, as well as the risks associated with anesthesia.  We will discuss these further at his upcoming visit.  We will need to coordinate with urology for stent placement preop.  I will see him in about 6 weeks.

## 2018-06-22 ENCOUNTER — Ambulatory Visit: Payer: BLUE CROSS/BLUE SHIELD | Admitting: Gastroenterology

## 2018-06-24 ENCOUNTER — Telehealth: Payer: Self-pay | Admitting: Gastroenterology

## 2018-06-24 ENCOUNTER — Ambulatory Visit: Payer: BLUE CROSS/BLUE SHIELD | Admitting: Gastroenterology

## 2018-06-24 NOTE — Telephone Encounter (Signed)
We rescheduled patient's appointment from 06-24-2018 to 09-14-18 due to the [facility] virus.

## 2018-08-02 ENCOUNTER — Ambulatory Visit: Payer: BLUE CROSS/BLUE SHIELD | Admitting: General Surgery

## 2018-08-16 ENCOUNTER — Telehealth: Payer: Self-pay | Admitting: Gastroenterology

## 2018-08-16 NOTE — Telephone Encounter (Signed)
-----   Message from Toney Reil, MD sent at 08/16/2018  9:25 AM EDT ----- Regarding: Virtual visit Please arrange a virtual visit with me this week  Thanks RV

## 2018-08-16 NOTE — Telephone Encounter (Signed)
Left vm for pt to call office and schedule virtual visit with Dr. Allegra Lai this week per her note

## 2018-08-24 ENCOUNTER — Ambulatory Visit (INDEPENDENT_AMBULATORY_CARE_PROVIDER_SITE_OTHER): Payer: BLUE CROSS/BLUE SHIELD | Admitting: Gastroenterology

## 2018-08-24 ENCOUNTER — Other Ambulatory Visit: Payer: Self-pay

## 2018-08-24 ENCOUNTER — Telehealth: Payer: Self-pay | Admitting: General Surgery

## 2018-08-24 ENCOUNTER — Ambulatory Visit
Admission: RE | Admit: 2018-08-24 | Discharge: 2018-08-24 | Disposition: A | Discharge status: Home / Self Care | Payer: BLUE CROSS/BLUE SHIELD | Source: Ambulatory Visit | Attending: Gastroenterology | Admitting: Gastroenterology

## 2018-08-24 DIAGNOSIS — K5792 Diverticulitis of intestine, part unspecified, without perforation or abscess without bleeding: Secondary | ICD-10-CM | POA: Insufficient documentation

## 2018-08-24 DIAGNOSIS — K5720 Diverticulitis of large intestine with perforation and abscess without bleeding: Secondary | ICD-10-CM

## 2018-08-24 HISTORY — DX: Type 2 diabetes mellitus without complications: E11.9

## 2018-08-24 LAB — POCT I-STAT CREATININE: Creatinine, Ser: 1.7 mg/dL — ABNORMAL HIGH (ref 0.61–1.24)

## 2018-08-24 MED ORDER — AMOXICILLIN-POT CLAVULANATE 875-125 MG PO TABS: 1.0000 | ORAL_TABLET | Freq: Two times a day (BID) | ORAL | 0 refills | Status: AC

## 2018-08-24 MED ORDER — IOHEXOL 300 MG/ML  SOLN
100.0000 mL | Freq: Once | INTRAMUSCULAR | Status: AC | PRN
  Administered 2018-08-24: 80 mL via INTRAVENOUS

## 2018-08-24 NOTE — Telephone Encounter (Signed)
Reviewed CT abdomen and pelvis results from today.  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  Forwarded results to his surgeon, Dr. Duanne Guess who discussed the results with patient over phone.  She is giving him another course of antibiotics.  Will cancel colonoscopy at this time  He also has mildly elevated creatinine, he should contact his PCP to follow-up on his creatinine  Arlyss Repress, MD [address]  [address]  [place], [place] [address]  Main: [phone]  Fax: [phone] Pager: [phone]

## 2018-08-24 NOTE — Progress Notes (Signed)
Sherri Sear, MD [address]  [place]  [place], [place] [address]  Main: [phone]  Fax: [phone]    Gastroenterology Consultation Video Visit  Referring Provider:     No ref. provider found Primary Care Physician:  Remi Haggard, FNP Primary Gastroenterologist:  Dr. Cephas Darby Reason for Consultation:     Perforated diverticulitis        HPI:   Jeffrey Boyle is a 53 y.o. male referred by Dr. Lavena Bullion, Jordan Likes, FNP  for consultation & management of recent perforated sigmoid diverticulitis  Virtual Visit Video Note  I connected with Tanna Furry on 08/24/18 at  8:30 AM EDT by video and verified that I am speaking with the correct person using two identifiers.   I discussed the limitations, risks, security and privacy concerns of performing an evaluation and management service by video and the availability of in person appointments. I also discussed with the patient that there may be a patient responsible charge related to this service. The patient expressed understanding and agreed to proceed.  Location of the Patient: Home  Location of the provider: Home office  Persons participating in the visit: Patient and provider   History of Present Illness:   Mr. Larry Alcock has hypertension, hyperlipidemia, recent diagnosis of diabetes, on oral antidiabetic medication who was admitted to [facility] end of July 2020 secondary to left lower quadrant pain and was found to have perforated sigmoid diverticulitis with abscess, treated conservatively with aspiration with small abscess and IV antibiotics.  He is referred to GI for colonoscopy prior to undergoing surgery.  Patient reports mild discomfort in left lower quadrant pain which is intermittent, otherwise denies rectal bleeding, altered bowel habits, fever, chills, nausea or vomiting.  He did not have a follow-up CT scan since the index CT scan.  He denies any other complaints today  NSAIDs: None   Antiplts/Anticoagulants/Anti thrombotics: None  GI Procedures: None He denies family history of GI malignancy He had history of appendectomy He denies smoking, alcohol 2 drinks per week  Past Medical History:  Diagnosis Date  . Hyperlipidemia   . Hypertension     Past Surgical History:  Procedure Laterality Date  . PILONIDAL CYST / SINUS EXCISION      Current Outpatient Medications:  .  Accu-Chek Softclix Lancets lancets, , Disp: , Rfl:  .  atorvastatin (LIPITOR) 40 MG tablet, , Disp: , Rfl:  .  Blood Glucose Monitoring Suppl (ACCU-CHEK GUIDE) w/Device KIT, , Disp: , Rfl:  .  lisinopril (ZESTRIL) 40 MG tablet, , Disp: , Rfl:  .  methylPREDNISolone (MEDROL DOSEPAK) 4 MG TBPK tablet, Take 4 mg by mouth as directed., Disp: , Rfl:  .  STEGLATRO 15 MG TABS, , Disp: , Rfl:  .  VASCEPA 1 g CAPS, , Disp: , Rfl:  .  benzonatate (TESSALON) 200 MG capsule, Take 200 mg by mouth 3 (three) times daily., Disp: , Rfl:  .  ibuprofen (ADVIL,MOTRIN) 800 MG tablet, Take 1 tablet (800 mg total) by mouth every 8 (eight) hours as needed for moderate pain. (Patient not taking: Reported on 08/24/2018), Disp: 15 tablet, Rfl: 0 .  lisinopril (ZESTRIL) 10 MG tablet, , Disp: , Rfl:  .  ondansetron (ZOFRAN) 4 MG tablet, Take 1 tablet (4 mg total) by mouth every 8 (eight) hours as needed for nausea or vomiting. (Patient not taking: Reported on 08/24/2018), Disp: 30 tablet, Rfl: 1 .  oxyCODONE (OXY IR/ROXICODONE) 5 MG immediate release tablet, Take  1 tablet (5 mg total) by mouth every 6 (six) hours as needed for severe pain or breakthrough pain. (Patient not taking: Reported on 08/24/2018), Disp: 15 tablet, Rfl: 0 .  tamsulosin (FLOMAX) 0.4 MG CAPS capsule, Take 1 capsule (0.4 mg total) by mouth once. (Patient not taking: Reported on 08/24/2018), Disp: 14 capsule, Rfl: 0    Family History  Problem Relation Age of Onset  . Hypertension Mother   . Stroke Mother   . Heart disease Brother      Social History    Tobacco Use  . Smoking status: Never Smoker  . Smokeless tobacco: Never Used  Substance Use Topics  . Alcohol use: Yes  . Drug use: Never    Allergies as of 08/24/2018  . (No Known Allergies)     Imaging Studies: Reviewed  Assessment and Plan:   Maximilien Hayashi is a 53 y.o. pleasant Caucasian male with metabolic syndrome, history of perforated sigmoid diverticulitis in 05/2018 is status post CT-guided aspiration of the abscess and antibiotics.  Referred here to discuss about preoperative colonoscopy.  Clinically better  Recommend follow-up CT scan prior to performing colonoscopy to evaluate complete resolution of the abscess Recommend colonoscopy after the CT scan   Follow Up Instructions:   I discussed the assessment and treatment plan with the patient. The patient was provided an opportunity to ask questions and all were answered. The patient agreed with the plan and demonstrated an understanding of the instructions.   The patient was advised to call back or seek an in-person evaluation if the symptoms worsen or if the condition fails to improve as anticipated.  I provided 25 minutes of face-to-face time during this encounter.   Follow up as needed   Cephas Darby, MD

## 2018-08-24 NOTE — Telephone Encounter (Signed)
Received communication from gastroenterology regarding a CT scan that they had ordered.  They were planning colonoscopy, however he had a recent episode of contained perforated diverticulitis.  There is persistent abscess and air.  No colonoscopy can be performed.  I will give him another course of antibiotics and have him repeat a CT scan in a few weeks.

## 2018-08-25 ENCOUNTER — Telehealth: Payer: Self-pay | Admitting: Gastroenterology

## 2018-08-25 NOTE — Telephone Encounter (Signed)
Called pt to move apt with Dr. Maximino Greenland pt is established with Dr. Allegra Lai and has virtual visit with her 08/24/18

## 2018-08-31 ENCOUNTER — Encounter: Payer: Self-pay | Admitting: Gastroenterology

## 2018-09-06 ENCOUNTER — Telehealth: Payer: Self-pay | Admitting: *Deleted

## 2018-09-06 NOTE — Telephone Encounter (Signed)
Patient states he can feel the  abscess under his skin. He completed the full course of Antibiotics that were prescribed 08/24/18.  He denies fever, chills. No pain. He just wanted to ask if he should be prescribed more medication.

## 2018-09-06 NOTE — Telephone Encounter (Signed)
Patient called and wanted to know if he can get some more amoxicillin, he stated that the abscess is still bothering him, no pain but he can fill that it is still there.

## 2018-09-08 NOTE — Telephone Encounter (Signed)
Patient notified that Dr.Cannon will discuss with him next week at appointment.

## 2018-09-08 NOTE — Telephone Encounter (Signed)
We probably just need to order another CT scan; I'm not sure what he means by feeling it under his skin, since it is deep.  He has a phone appointment with me next week.  I'll talk to him about it.

## 2018-09-14 ENCOUNTER — Ambulatory Visit: Payer: BLUE CROSS/BLUE SHIELD | Admitting: Gastroenterology

## 2018-09-15 ENCOUNTER — Other Ambulatory Visit: Payer: Self-pay

## 2018-09-15 ENCOUNTER — Telehealth (INDEPENDENT_AMBULATORY_CARE_PROVIDER_SITE_OTHER): Payer: BC Managed Care – PPO | Admitting: General Surgery

## 2018-09-15 ENCOUNTER — Telehealth: Payer: Self-pay | Admitting: *Deleted

## 2018-09-15 DIAGNOSIS — K5720 Diverticulitis of large intestine with perforation and abscess without bleeding: Secondary | ICD-10-CM | POA: Diagnosis not present

## 2018-09-15 MED ORDER — AMOXICILLIN-POT CLAVULANATE 875-125 MG PO TABS: 1.0000 | ORAL_TABLET | Freq: Two times a day (BID) | ORAL | 0 refills | Status: AC

## 2018-09-15 NOTE — Addendum Note (Signed)
Addended by: Carson Myrtle on: 09/15/2018 02:00 PM   Modules accepted: Orders

## 2018-09-15 NOTE — Progress Notes (Signed)
Virtual Visit via Telephone Note  I connected with Jeffrey Boyle on 09/15/18 at  1:30 PM EDT by telephone and verified that I am speaking with the correct person using two identifiers.   I discussed the limitations, risks, security and privacy concerns of performing an evaluation and management service by telephone and the availability of in person appointments. I also discussed with the patient that there may be a patient responsible charge related to this service. The patient expressed understanding and agreed to proceed.   History of Present Illness: Jeffrey Boyle is a 53 year old man who was admitted to the hospital in February with contained perforated diverticulitis.  He was treated with antibiotics and drainage of the abscess.  He was scheduled to undergo colonoscopy, but a repeat CT scan performed prior to that test showed a persistent fluid collection with a small fistulous tract from the colon, therefore the colonoscopy was canceled.  I put Jeffrey Boyle on a two-week course of antibiotics.  He contacted our office last week, saying that he felt like he could still feel the abscess under his skin on his left abdomen.  Today's visit is to further address this and determine the next step in treating him for his diverticulitis.   Observations/Objective: Jeffrey Boyle denies any fevers or chills.  He does not have abdominal pain.  He still feels the sensation of something in his skin on the left side of his abdomen.  He is having regular bowel movements.  I reviewed the CT scan done Aug 24, 2018.  There is no subcutaneous mass or abscess.  The fluid collection and fistulous tract of concern is located down in the pelvis just proximal to the rectosigmoid junction.  Assessment and Plan: We will do another 2-week course of Augmentin followed by a repeat CT scan.  Based upon those findings, hopefully we will be able to schedule him for colonoscopy and proceed with his sigmoid colectomy.  Follow Up  Instructions: Pending CT results   I discussed the assessment and treatment plan with the patient. The patient was provided an opportunity to ask questions and all were answered. The patient agreed with the plan and demonstrated an understanding of the instructions.   The patient was advised to call back or seek an in-person evaluation if the symptoms worsen or if the condition fails to improve as anticipated.  I provided 10 minutes of non-face-to-face time during this encounter.   Fredirick Maudlin, MD

## 2018-09-15 NOTE — Telephone Encounter (Signed)
Abdomen and pelvis CT scan scheduled for 10-06-18 at 11:00 at [facility]. Pick up prep kit ahead of time. Nothing to eat or drink 4 hours before the scan.

## 2018-09-15 NOTE — Telephone Encounter (Signed)
Patient called back, all information was given to the patient.

## 2018-10-06 ENCOUNTER — Other Ambulatory Visit: Payer: Self-pay

## 2018-10-06 ENCOUNTER — Ambulatory Visit
Admission: RE | Admit: 2018-10-06 | Discharge: 2018-10-06 | Disposition: A | Discharge status: Home / Self Care | Payer: BC Managed Care – PPO | Source: Ambulatory Visit | Attending: General Surgery | Admitting: General Surgery

## 2018-10-06 DIAGNOSIS — K5720 Diverticulitis of large intestine with perforation and abscess without bleeding: Secondary | ICD-10-CM | POA: Insufficient documentation

## 2018-10-06 LAB — POCT I-STAT CREATININE: Creatinine, Ser: 0.9 mg/dL (ref 0.61–1.24)

## 2018-10-06 MED ORDER — IOHEXOL 300 MG/ML  SOLN
100.0000 mL | Freq: Once | INTRAMUSCULAR | Status: AC | PRN
  Administered 2018-10-06: 100 mL via INTRAVENOUS

## 2018-10-07 ENCOUNTER — Telehealth: Payer: Self-pay | Admitting: General Surgery

## 2018-10-07 NOTE — Telephone Encounter (Signed)
Spoke w/pt re results of CT scan. Small perforation still present, so cannot do colonoscopy pre-op.  Will go ahead and schedule sigmoid colectomy.  Will arrange ureteral stents and plan abx for 2 weeks pre-op.

## 2018-11-03 ENCOUNTER — Telehealth: Payer: Self-pay | Admitting: *Deleted

## 2018-11-03 NOTE — Telephone Encounter (Signed)
Patient called the office this morning.   He states that he would like to keep follow up appointment with Dr. Celine Ahr as scheduled for 11-18-18 but wants to cancel surgery scheduled for 11-24-18. States he is feeling better at this time.  Dr. Celine Ahr notified of the above.

## 2018-11-17 ENCOUNTER — Other Ambulatory Visit: Payer: BC Managed Care – PPO

## 2018-11-18 ENCOUNTER — Ambulatory Visit: Payer: BC Managed Care – PPO | Admitting: General Surgery

## 2018-11-19 ENCOUNTER — Other Ambulatory Visit: Payer: BC Managed Care – PPO

## 2018-11-24 ENCOUNTER — Inpatient Hospital Stay: Admit: 2018-11-24 | Payer: BC Managed Care – PPO | Admitting: General Surgery

## 2018-11-24 SURGERY — COLECTOMY, SIGMOID, OPEN
Anesthesia: General

## 2019-01-10 ENCOUNTER — Telehealth: Payer: Self-pay | Admitting: Gastroenterology

## 2019-01-10 ENCOUNTER — Telehealth: Payer: Self-pay

## 2019-01-10 NOTE — Telephone Encounter (Signed)
Patient has been advised to contact [facility] office in regards to scheduling colonoscopy.  Her  10/07/18 office note stated that colonoscopy could not be done at the time.  He will call Jennifer's office and call us back if needed to discuss further with [place].  Thanks [employer]

## 2019-01-10 NOTE — Telephone Encounter (Signed)
Patient has persistent colonic perforation.  Colonoscopy is contraindicated.  He should contact Dr. Glenford Peers office to schedule his surgery.  I can perform colonoscopy after the surgery  Sherri Sear, MD

## 2019-01-10 NOTE — Telephone Encounter (Signed)
Pt is calling to r/s his procedure with Dr. Marius Ditch

## 2019-02-08 ENCOUNTER — Ambulatory Visit: Payer: Self-pay | Admitting: General Surgery

## 2019-02-08 NOTE — H&P (Signed)
History of Present Illness Jeffrey Ruff MD; 56/05[birth year]06 10:51 AM) The patient is a 53 year old male who presents with diverticulitis. 53 year old male who presents to the office for evaluation of diverticular disease and a second opinion. He was hospitalized in February 2020 with influenza and diverticulitis with perforation and abscess. He is status post drain placement. He was in the hospital for approximately 1 week. He then had a follow-up CT scan in July which showed a small persistent pericolonic abscess. He saw Dr. Celine Ahr in [facility] and a follow-up colonoscopy was recommended. His GI doctor did not want to do the colonoscopy due to the persistent abscess. He is feeling much better. He is here today for a second opinion on surgical management of his complex diverticular disease.   Past Surgical History (Tanisha A. Owens Shark, [place]; 02/08/2019 9:46 AM) Appendectomy  Diagnostic Studies History (Tanisha A. Owens Shark, [place]; 02/08/2019 9:46 AM) Colonoscopy never  Allergies (Tanisha A. Owens Shark, [place]; 02/08/2019 9:46 AM) No Known Drug Allergies [02/08/2019]: Allergies Reconciled  Medication History (Tanisha A. Owens Shark, [place]; 02/08/2019 9:47 AM) Atorvastatin Calcium (40MG  Tablet, Oral) Active. Lisinopril (40MG  Tablet, Oral) Active. Vascepa (1GM Capsule, Oral) Active. Steglatro (15MG  Tablet, Oral) Active. Vitamin D (50000U Tablet, Oral) Active. Medications Reconciled  Social History (Tanisha A. Owens Shark, [place]; 02/08/2019 9:46 AM) Alcohol use Moderate alcohol use. Caffeine use Coffee, Tea. Tobacco use Never smoker.  Family History (Tanisha A. Owens Shark, [place]; 02/08/2019 9:46 AM) Cerebrovascular Accident Mother. Depression Brother, Mother. Diabetes Mellitus Brother, Mother. Heart Disease Brother. Heart disease in male family member before age 87 Hypertension Brother, Mother.  Other Problems (Tanisha A. Owens Shark, [place]; 02/08/2019 9:46 AM) Back Pain Diabetes Mellitus High blood  pressure Hypercholesterolemia Kidney Stone Other disease, cancer, significant illness     Review of Systems (Tanisha A. Brown RMA; 02/08/2019 9:46 AM) General Not Present- Appetite Loss, Chills, Fatigue, Fever, Night Sweats, Weight Gain and Weight Loss. Skin Present- Dryness. Not Present- Change in Wart/Mole, Hives, Jaundice, New Lesions, Non-Healing Wounds, Rash and Ulcer. HEENT Present- Wears glasses/contact lenses. Not Present- Earache, Hearing Loss, Hoarseness, Nose Bleed, Oral Ulcers, Ringing in the Ears, Seasonal Allergies, Sinus Pain, Sore Throat, Visual Disturbances and Yellow Eyes. Respiratory Not Present- Bloody sputum, Chronic Cough, Difficulty Breathing, Snoring and Wheezing. Breast Not Present- Breast Mass, Breast Pain, Nipple Discharge and Skin Changes. Cardiovascular Not Present- Chest Pain, Difficulty Breathing Lying Down, Leg Cramps, Palpitations, Rapid Heart Rate, Shortness of Breath and Swelling of Extremities. Gastrointestinal Present- Excessive gas. Not Present- Abdominal Pain, Bloating, Bloody Stool, Change in Bowel Habits, Chronic diarrhea, Constipation, Difficulty Swallowing, Gets full quickly at meals, Hemorrhoids, Indigestion, Nausea, Rectal Pain and Vomiting. Male Genitourinary Present- Frequency and Nocturia. Not Present- Blood in Urine, Change in Urinary Stream, Impotence, Painful Urination, Urgency and Urine Leakage. Musculoskeletal Not Present- Back Pain, Joint Pain, Joint Stiffness, Muscle Pain, Muscle Weakness and Swelling of Extremities. Neurological Present- Numbness and Tingling. Not Present- Decreased Memory, Fainting, Headaches, Seizures, Tremor, Trouble walking and Weakness. Psychiatric Not Present- Anxiety, Bipolar, Change in Sleep Pattern, Depression, Fearful and Frequent crying. Endocrine Present- New Diabetes. Not Present- Cold Intolerance, Excessive Hunger, Hair Changes, Heat Intolerance and Hot flashes. Hematology Not Present- Blood Thinners, Easy  Bruising, Excessive bleeding, Gland problems, HIV and Persistent Infections.  Vitals (Tanisha A. Brown RMA; 02/08/2019 9:46 AM) 02/08/2019 9:46 AM Weight: 205.6 lb Height: 68in Body Surface Area: 2.07 m Body Mass Index: 31.26 kg/m  Temp.: 97.80F  Pulse: 82 (Regular)  BP: 128/86 (Sitting, Left Arm, Standard)  Physical Exam Romie Levee MD; 02/08/2019 10:52 AM)  General Mental Status-Alert. General Appearance-Not in acute distress. Build & Nutrition-Well nourished. Posture-Normal posture. Gait-Normal.  Head and Neck Head-normocephalic, atraumatic with no lesions or palpable masses. Trachea-midline.  Chest and Lung Exam Chest and lung exam reveals -on auscultation, normal breath sounds, no adventitious sounds and normal vocal resonance.  Cardiovascular Cardiovascular examination reveals -normal heart sounds, regular rate and rhythm with no murmurs and no digital clubbing, cyanosis, edema, increased warmth or tenderness.  Abdomen Inspection Inspection of the abdomen reveals - No Hernias. Palpation/Percussion Palpation and Percussion of the abdomen reveal - Soft, Non Tender, No Rigidity (guarding), No hepatosplenomegaly and No Palpable abdominal masses.  Neurologic Neurologic evaluation reveals -alert and oriented x 3 with no impairment of recent or remote memory, normal attention span and ability to concentrate, normal sensation and normal coordination.  Musculoskeletal Normal Exam - Bilateral-Upper Extremity Strength Normal and Lower Extremity Strength Normal.    Assessment & Plan Romie Levee MD; 02/08/2019 10:13 AM)  DIVERTICULITIS OF LARGE INTESTINE WITH ABSCESS WITHOUT BLEEDING (K57.20) Impression: 53 year old male with complicated diverticular disease and persistent abscess. It was recommended that he undergo colonoscopy by his GI MD did not want to do this due to the active abscess. He comes to me for further evaluation.  We discussed sigmoid colectomy to help prevent recurrences of his diverticulitis as well as to rid himself of the pathology so that he may have a colonoscopy. We discussed that since he is not having a preoperative colonoscopy, I would recommend performing the surgery as if he had a cancer in case anything is found in the final pathology. We also discussed the need for colonoscopy after surgery. We discussed using the robot to do this procedure in order to minimize his postoperative recovery and incision sizes. We also discussed that he would need to be free of heavy lifting for approximately 6-8 weeks after surgery. I will get a repeat CT scan to evaluate his abdomen and make sure there have been no changes since his last one 4 months ago. The surgery and anatomy were described to the patient as well as the risks of surgery and the possible complications. These include: Bleeding, deep abdominal infections and possible wound complications such as hernia and infection, damage to adjacent structures, leak of surgical connections, which can lead to other surgeries and possibly an ostomy, possible need for other procedures, such as abscess drains in radiology, possible prolonged hospital stay, possible diarrhea from removal of part of the colon, possible constipation from narcotics, possible bowel, bladder or sexual dysfunction if having rectal surgery, prolonged fatigue/weakness or appetite loss, possible early recurrence of of disease, possible complications of their medical problems such as heart disease or arrhythmias or lung problems, death (less than 1%). I believe the patient understands and wishes to proceed with the surgery.

## 2019-02-11 ENCOUNTER — Other Ambulatory Visit: Payer: Self-pay | Admitting: General Surgery

## 2019-02-11 DIAGNOSIS — K5720 Diverticulitis of large intestine with perforation and abscess without bleeding: Secondary | ICD-10-CM

## 2019-09-18 IMAGING — CT CT ABD-PELV W/ CM
2 of 5 series · 16 of 46 positions shown, 18 images · IV contrast (APPLIED)
Comparison: Noncontrast CT on 06/01/2018

CLINICAL DATA: Follow-up perforated diverticulitis. Abdominal pain
and fever. Evaluate for abscess.

EXAM:
CT ABDOMEN AND PELVIS WITH CONTRAST
TECHNIQUE: Multidetector CT imaging of the abdomen and pelvis was performed
using the standard protocol following bolus administration of
intravenous contrast.
CONTRAST:  100mL OMNIPAQUE IOHEXOL 300 MG/ML  SOLN

[Series 2: axial st · axial · 0.76mm/px · z∈[-520,-20]mm · 13 of 114 slices shown, 15 images]
[im 7/114  soft-tissue]
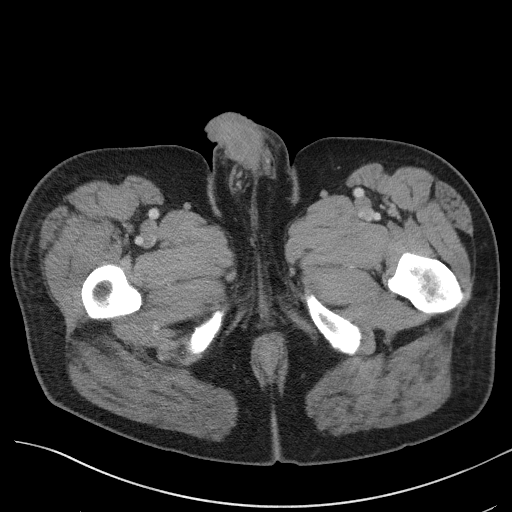
[im 7/114  bone]
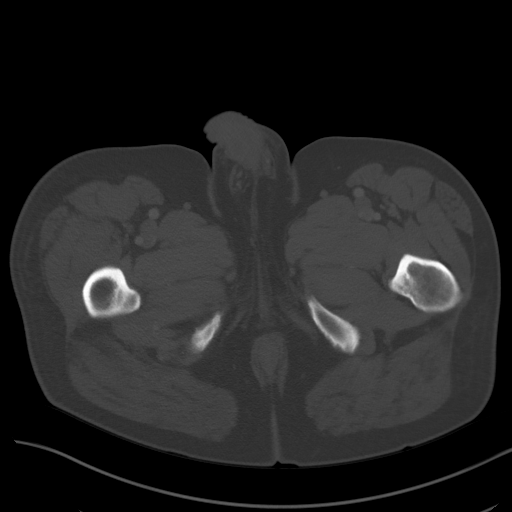
[im 13/114  soft-tissue]
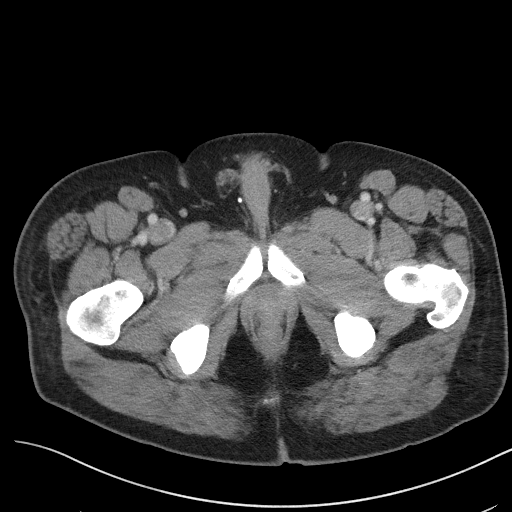
[im 26/114  soft-tissue]
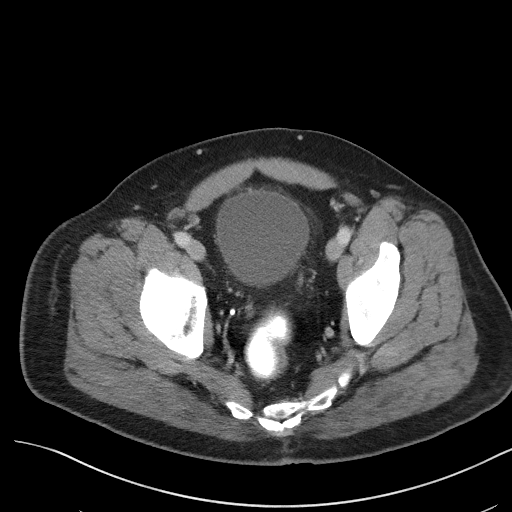
[im 32/114  soft-tissue]
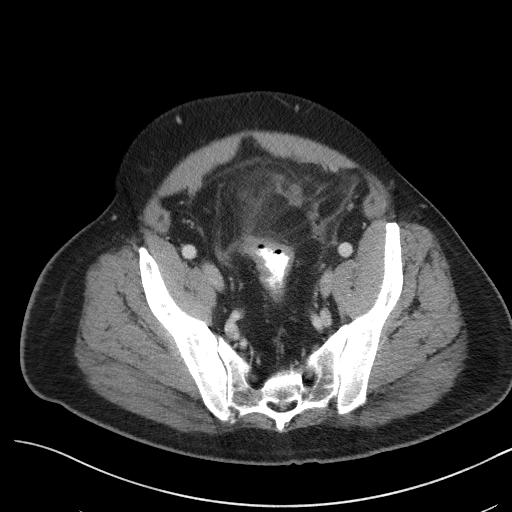
[im 38/114  soft-tissue]
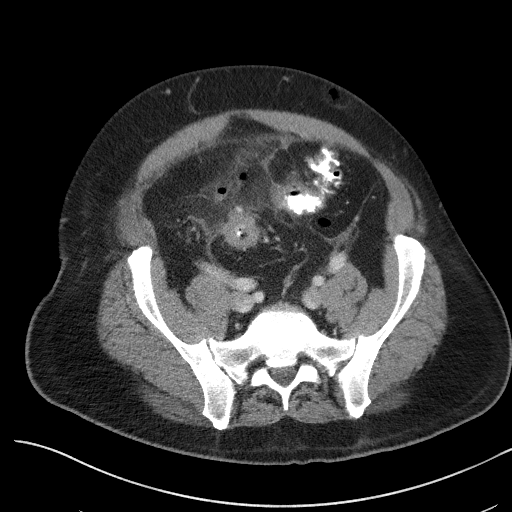
[im 51/114  soft-tissue]
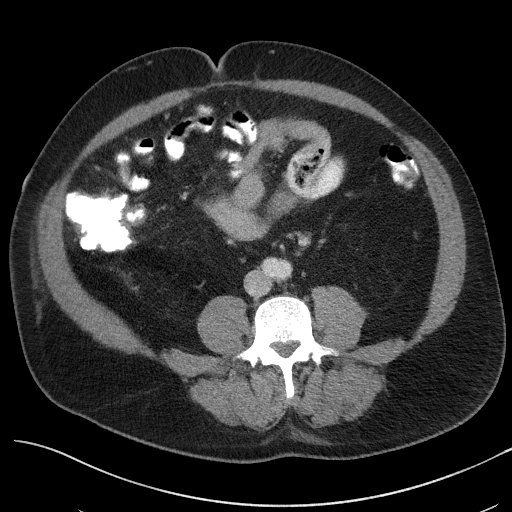
[im 57/114  soft-tissue]
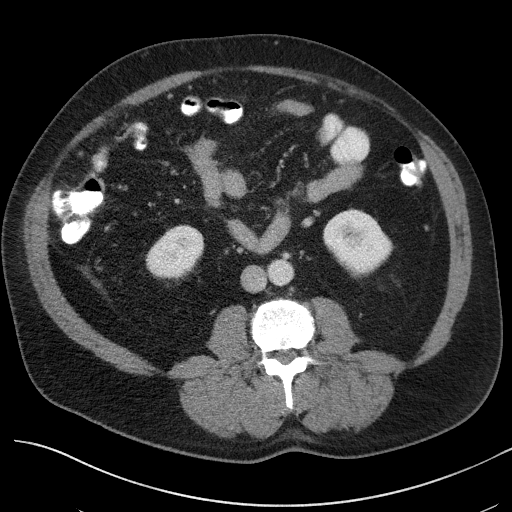
[im 63/114  soft-tissue]
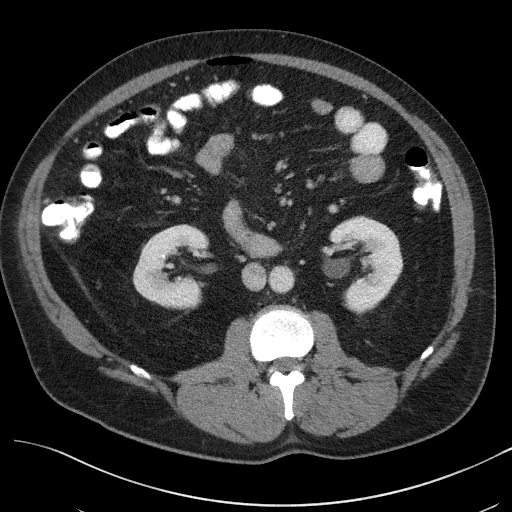
[im 76/114  soft-tissue]
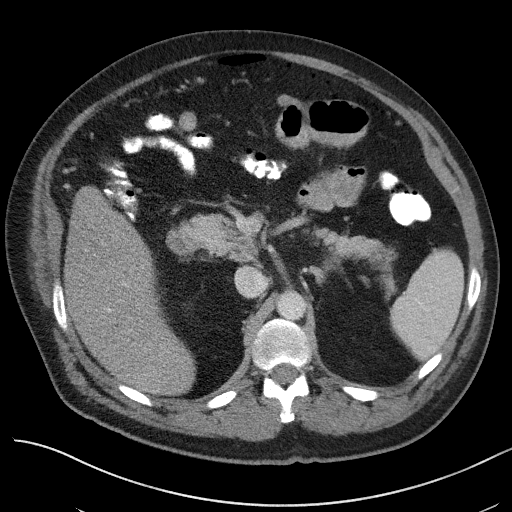
[im 76/114  bone]
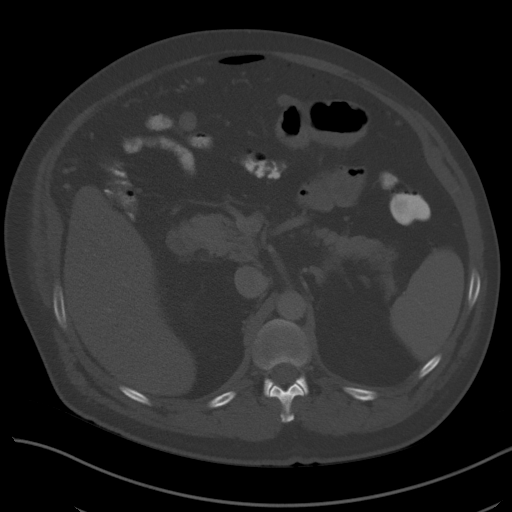
[im 82/114  soft-tissue]
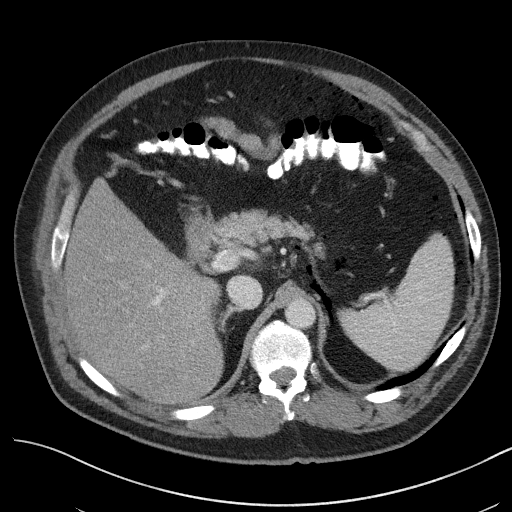
[im 88/114  soft-tissue]
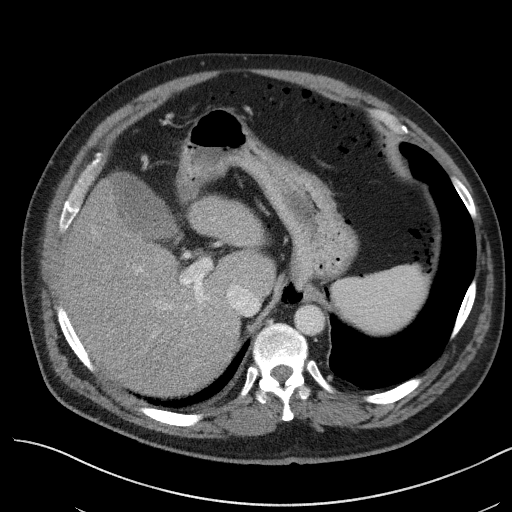
[im 101/114  soft-tissue]
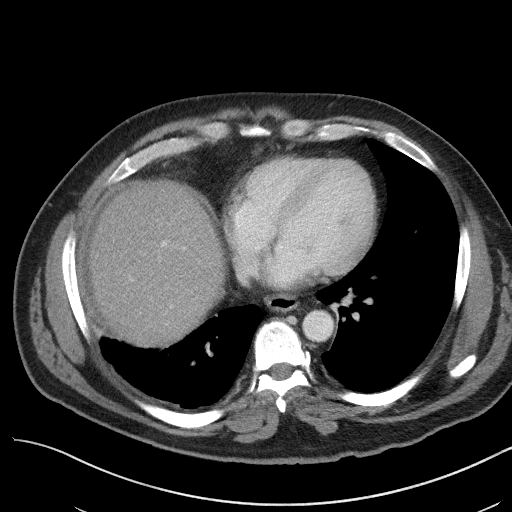
[im 107/114  soft-tissue]
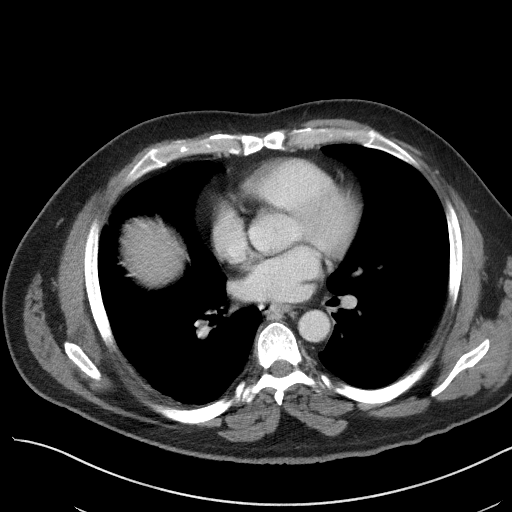

[Series 5: coronal st · coronal · 0.87mm/px · 3 of 106 slices shown]
[im 36/106  soft-tissue]
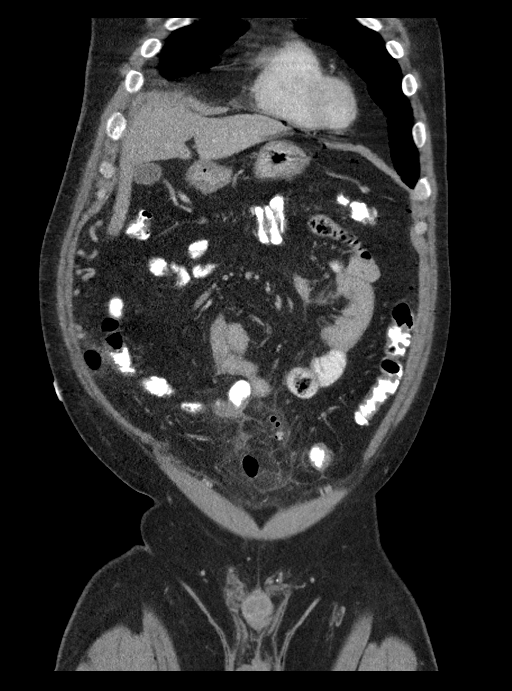
[im 47/106  soft-tissue]
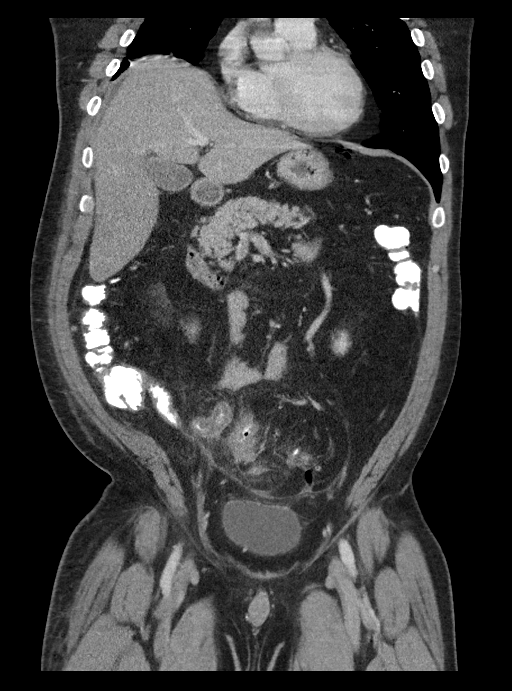
[im 59/106  soft-tissue]
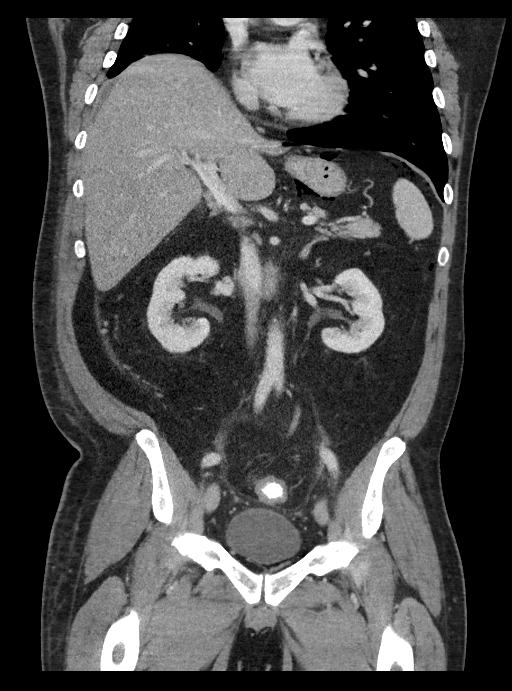

[16 of 46 positions shown; findings below may reference images not displayed]

FINDINGS: Lower Chest: No acute findings.

Hepatobiliary: No hepatic masses identified. Mild-to-moderate
diffuse hepatic steatosis again noted. Gallbladder is unremarkable.

Pancreas:  No mass or inflammatory changes.

Spleen: Within normal limits in size and appearance.

Adrenals/Urinary Tract: No masses identified. Several renal calculi
are seen bilaterally, largest in the right kidney measuring 8 mm. No
evidence of ureteral calculi or hydronephrosis. Unremarkable
unopacified urinary bladder.

Stomach/Bowel: Moderate sigmoid diverticulitis has significantly
increased since previous study. Pneumoperitoneum is again
demonstrated. Increased pericolonic inflammatory changes are seen in
the pelvis with secondary involvement of adjacent distal ileum. No
evidence of small bowel obstruction.

Several new small pericolonic fluid and gas collections are seen
adjacent to the sigmoid colon, largest measuring 3.0 cm in diameter,
suspicious for developing abscess.

Vascular/Lymphatic: No pathologically enlarged lymph nodes. No
abdominal aortic aneurysm.

Reproductive:  No mass or other significant abnormality.

Other:  None.

Musculoskeletal:  No suspicious bone lesions identified.
IMPRESSION: Perforated sigmoid diverticulitis, with persistent pneumoperitoneum
demonstrated.

Increased severity of moderate sigmoid diverticulitis since prior
exam. Multiple small pericolonic fluid and gas collections in the
pelvis measuring up to 3 cm, suspicious for developing abscesses.

Incidentally noted bilateral nephrolithiasis and hepatic steatosis.

## 2019-12-08 IMAGING — CT CT ABDOMEN AND PELVIS WITH CONTRAST
2 of 5 series · 15 of 46 positions shown, 17 images · IV contrast (APPLIED)
Comparison: 06/04/2018

CLINICAL DATA: Follow-up diverticulitis

EXAM:
CT ABDOMEN AND PELVIS WITH CONTRAST
TECHNIQUE: Multidetector CT imaging of the abdomen and pelvis was performed
using the standard protocol following bolus administration of
intravenous contrast.
CONTRAST:  80mL OMNIPAQUE IOHEXOL 300 MG/ML  SOLN

[Series 2: routine abd/pel with · axial · 0.76mm/px · z∈[-514,-39]mm · 12 of 107 slices shown, 14 images]
[im 6/107  soft-tissue]
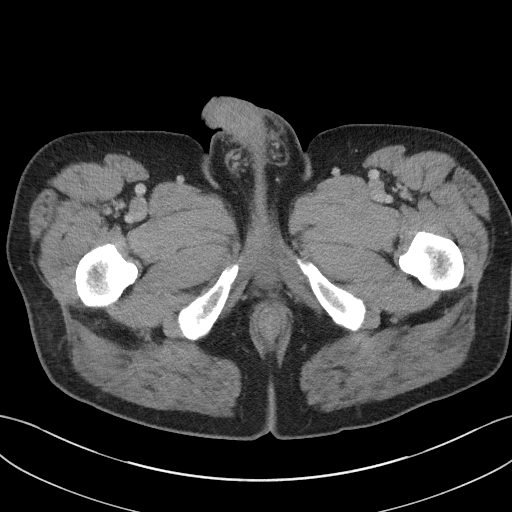
[im 6/107  bone]
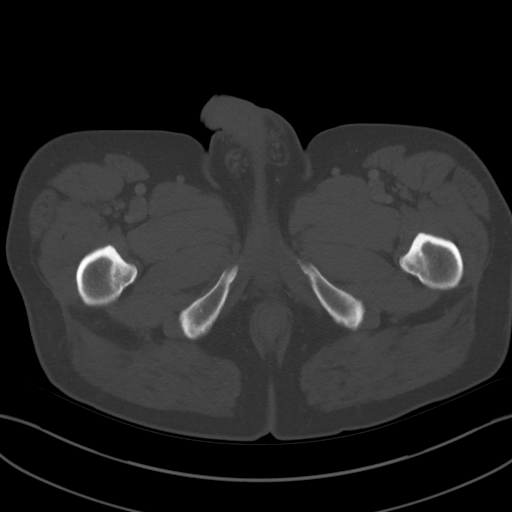
[im 17/107  soft-tissue]
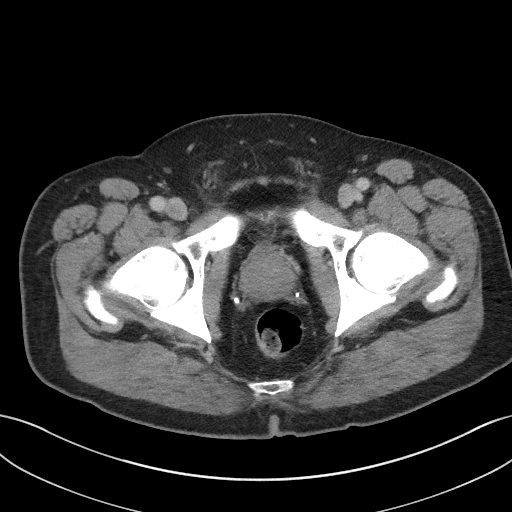
[im 23/107  soft-tissue]
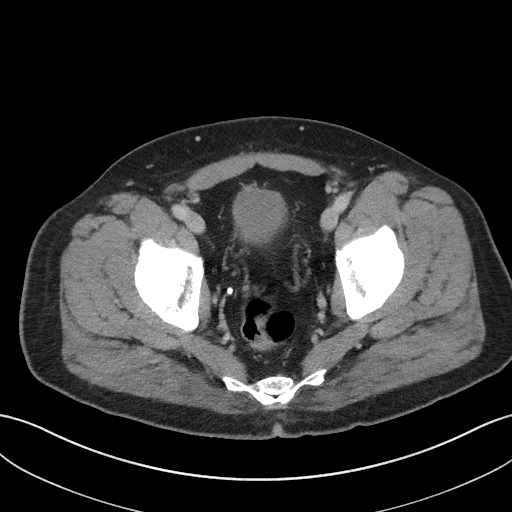
[im 34/107  soft-tissue]
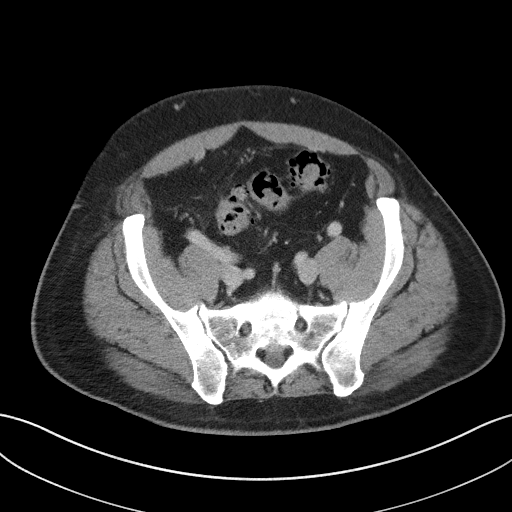
[im 40/107  soft-tissue]
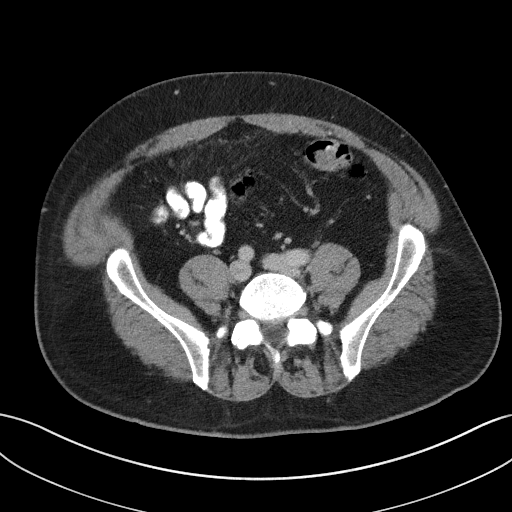
[im 51/107  soft-tissue]
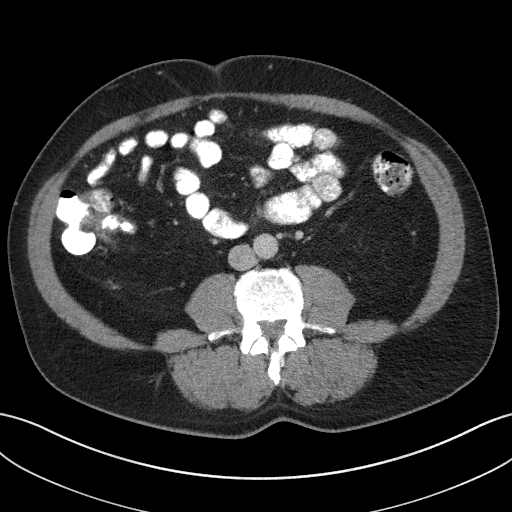
[im 56/107  soft-tissue]
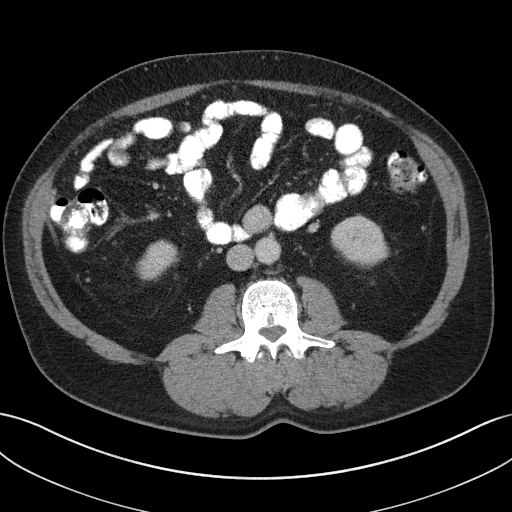
[im 67/107  soft-tissue]
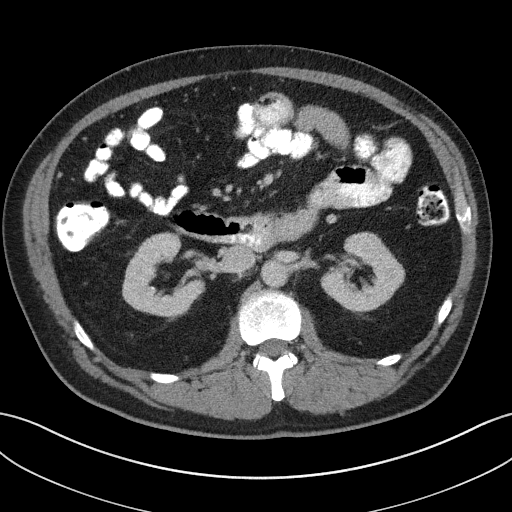
[im 73/107  soft-tissue]
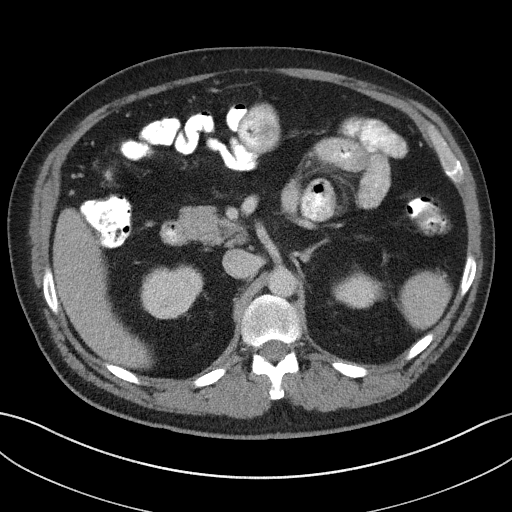
[im 73/107  bone]
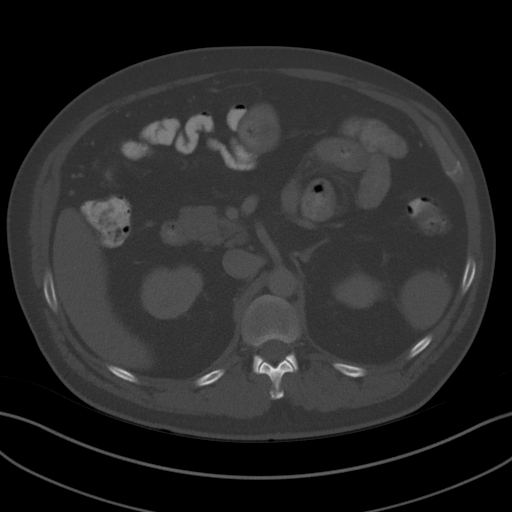
[im 84/107  soft-tissue]
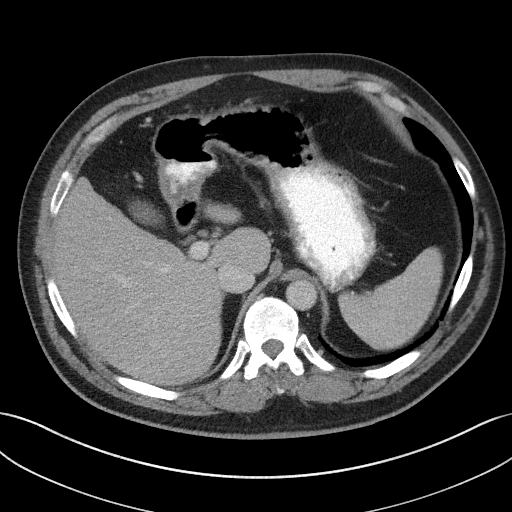
[im 90/107  soft-tissue]
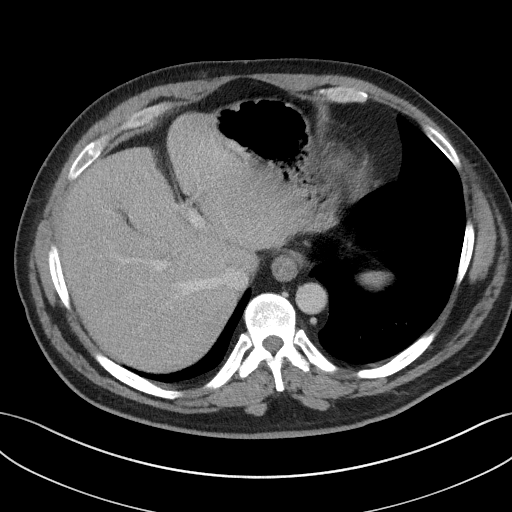
[im 101/107  soft-tissue]
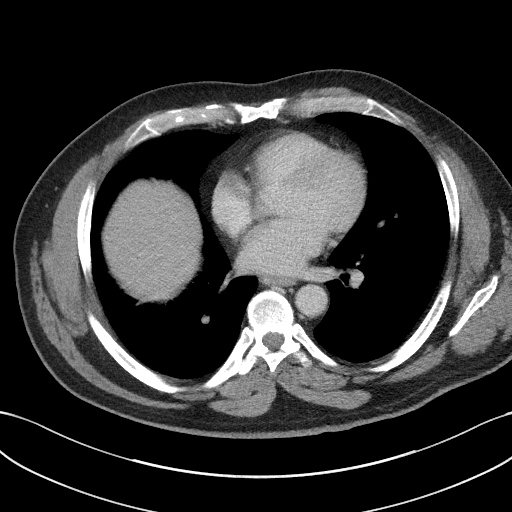

[Series 5: coronal st · coronal · 0.74mm/px · 3 of 102 slices shown]
[im 34/102  soft-tissue]
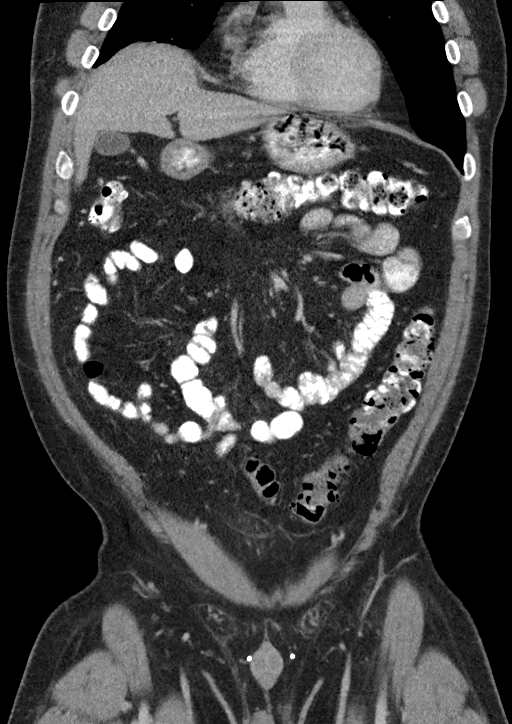
[im 45/102  soft-tissue]
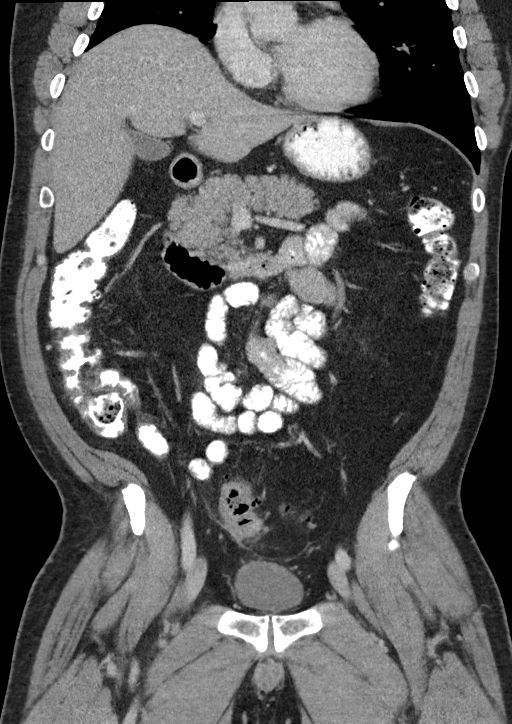
[im 57/102  soft-tissue]
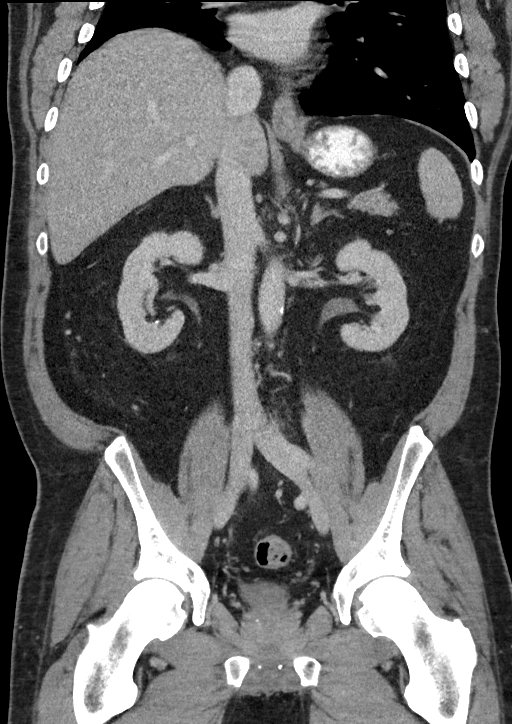

[15 of 46 positions shown; findings below may reference images not displayed]

FINDINGS: Lower chest: No acute abnormality.

Hepatobiliary: No focal liver abnormality is seen. No gallstones,
gallbladder wall thickening, or biliary dilatation.

Pancreas: Unremarkable. No pancreatic ductal dilatation or
surrounding inflammatory changes.

Spleen: Normal in size without focal abnormality.

Adrenals/Urinary Tract: Normal appearance of the adrenal glands. The
left kidney is normal. Right renal calculi. The largest stone is in
the mid right kidney measuring 1.1 cm, image 40/2. no hydronephrosis
or hydroureter identified bilaterally.

Stomach/Bowel: The stomach appears normal. The small bowel loops
have a normal course and caliber without obstruction. Interval
improvement in wall thickening and inflammation associated with
acute sigmoid diverticulitis. Persistent extraluminal collection of
gas measures 2 cm in maximum dimension, image 79/2 and image 40/5.
This has a well-defined enhancing margins and communicates with the
lumen of the sigmoid colon by a thin fistula/sinus tract, image
65/6. Previously 2.3 cm. No new fluid collections identified. No
abscess.

Vascular/Lymphatic: Aortic atherosclerosis. No adenopathy within the
abdomen or pelvis.

Reproductive: Prostate is unremarkable.

Other: No abdominal wall hernia or abnormality. No abdominopelvic
ascites.

Musculoskeletal: No acute or significant osseous findings.
IMPRESSION: 1. Resolving inflammation associated with known perforated sigmoid
diverticulitis.
2. Mature extraluminal collection of gas is again noted status post
fluid aspiration. This is slightly decreased in volume when compared
with 06/04/18. This appears to communicate with anterior wall of
sigmoid colon. No new findings.

## 2020-05-04 ENCOUNTER — Ambulatory Visit
Admission: RE | Admit: 2020-05-04 | Discharge: 2020-05-04 | Disposition: A | Discharge status: Home / Self Care | Payer: BC Managed Care – PPO | Source: Ambulatory Visit | Attending: Emergency Medicine | Admitting: Emergency Medicine

## 2020-05-04 ENCOUNTER — Other Ambulatory Visit: Payer: Self-pay

## 2020-05-04 VITALS — BP 180/112 | HR 93 | Temp 97.9°F | Resp 17 | Ht 68.0 in | Wt 215.0 lb

## 2020-05-04 DIAGNOSIS — M5441 Lumbago with sciatica, right side: Secondary | ICD-10-CM | POA: Diagnosis not present

## 2020-05-04 MED ORDER — PREDNISONE 10 MG (21) PO TBPK: ORAL_TABLET | Freq: Every day | ORAL | 0 refills | Status: AC

## 2020-05-04 NOTE — ED Provider Notes (Signed)
[facility]    CSN: [id number] Arrival date & time: 05/04/20  1135      History   Chief Complaint Chief Complaint  Patient presents with  . Appointment  . Back Pain    HPI Jeffrey Boyle is a 55 y.o. male.   HPI   55 year old male here for evaluation of low back pain that radiates into both legs that started 2 days ago.  Patient reports that he jerked trying to move one of his cats and felt this catch in his low back.  The pain is greater on the right than the left.  Patient states that he does have intermittent numbness and tingling in both legs but is not constant.  Patient denies incontinence, or perineal numbness.  Patient has been using ice and heat which has helped.  The pain is worse with walking, or bending at the waist.  No increase of pain with lateral rotation.  Past Medical History:  Diagnosis Date  . Diabetes mellitus without complication ([place])   . Hyperlipidemia   . Hypertension     Patient Active Problem List   Diagnosis Date Noted  . Diverticulitis of large intestine with perforation without bleeding 06/01/2018    Past Surgical History:  Procedure Laterality Date  . PILONIDAL CYST / SINUS EXCISION         Home Medications    Prior to Admission medications   Medication Sig Start Date End Date Taking? Authorizing Provider  Accu-Chek Softclix Lancets lancets  07/01/18  Yes [provider]  atorvastatin (LIPITOR) 40 MG tablet  07/27/18  Yes [provider]  Blood Glucose Monitoring Suppl (ACCU-CHEK GUIDE) w/Device KIT  07/01/18  Yes [provider]  ibuprofen (ADVIL,MOTRIN) 800 MG tablet Take 1 tablet (800 mg total) by mouth every 8 (eight) hours as needed for moderate pain. 09/04/14  Yes Paulette Blanch, MD  lisinopril (ZESTRIL) 40 MG tablet  08/23/18  Yes [provider]  predniSONE (STERAPRED UNI-PAK 21 TAB) 10 MG (21) TBPK tablet Take by mouth daily. Take 6 tabs by mouth daily  for 2 days, then 5 tabs for 2  days, then 4 tabs for 2 days, then 3 tabs for 2 days, 2 tabs for 2 days, then 1 tab by mouth daily for 2 days 05/04/20  Yes Margarette Canada, NP  STEGLATRO 15 MG TABS  08/15/18  Yes [provider]  VASCEPA 1 g CAPS  07/01/18  Yes [provider]  oxyCODONE (OXY IR/ROXICODONE) 5 MG immediate release tablet Take 1 tablet (5 mg total) by mouth every 6 (six) hours as needed for severe pain or breakthrough pain. Patient not taking: Reported on 08/24/2018 06/07/18   Tylene Fantasia, PA-C    Family History Family History  Problem Relation Age of Onset  . Hypertension Mother   . Stroke Mother   . Heart disease Brother     Social History Social History   Tobacco Use  . Smoking status: Never Smoker  . Smokeless tobacco: Never Used  Substance Use Topics  . Alcohol use: Yes  . Drug use: Never     Allergies   Patient has no known allergies.   Review of Systems Review of Systems  Constitutional: Negative for activity change and appetite change.  Musculoskeletal: Positive for back pain.  Skin: Negative for rash.  Neurological: Positive for numbness. Negative for weakness.  Hematological: Negative.   Psychiatric/Behavioral: Negative.      Physical Exam Triage Vital Signs ED Triage Vitals  Enc Vitals Group     BP 05/04/20 1156 (!) 180/112     Pulse Rate 05/04/20 1156 93     Resp 05/04/20 1156 17     Temp 05/04/20 1156 97.9 F (36.6 C)     Temp Source 05/04/20 1156 Oral     SpO2 05/04/20 1156 99 %     Weight 05/04/20 1154 215 lb (97.5 kg)     Height 05/04/20 1154 '5\' 8"'  (1.727 m)     Head Circumference --      Peak Flow --      Pain Score 05/04/20 1154 8     Pain Loc --      Pain Edu? --      Excl. in [place]? --    No data found.  Updated Vital Signs BP (!) 180/112 (BP Location: Left Arm)   Pulse 93   Temp 97.9 F (36.6 C) (Oral)   Resp 17   Ht '5\' 8"'  (1.727 m)   Wt 215 lb (97.5 kg)   SpO2 99%   BMI 32.69 kg/m   Visual Acuity Right Eye Distance:   Left  Eye Distance:   Bilateral Distance:    Right Eye Near:   Left Eye Near:    Bilateral Near:     Physical Exam Vitals and nursing note reviewed.  Constitutional:      General: He is not in acute distress.    Appearance: Normal appearance. He is obese. He is not ill-appearing.  HENT:     Head: Normocephalic and atraumatic.  Cardiovascular:     Rate and Rhythm: Normal rate and regular rhythm.     Pulses: Normal pulses.     Heart sounds: Normal heart sounds. No murmur heard. No gallop.   Pulmonary:     Effort: Pulmonary effort is normal.     Breath sounds: Normal breath sounds. No wheezing, rhonchi or rales.  Musculoskeletal:        General: No swelling, tenderness or deformity.  Skin:    General: Skin is warm and dry.     Capillary Refill: Capillary refill takes less than 2 seconds.     Findings: No erythema.  Neurological:     General: No focal deficit present.     Mental Status: He is alert and oriented to person, place, and time.  Psychiatric:        Mood and Affect: Mood normal.        Behavior: Behavior normal.        Thought Content: Thought content normal.        Judgment: Judgment normal.      UC Treatments / Results  Labs (all labs ordered are listed, but only abnormal results are displayed) Labs Reviewed - No data to display  EKG   Radiology No results found.  Procedures Procedures (including critical care time)  Medications Ordered in UC Medications - No data to display  Initial Impression / Assessment and Plan / UC Course  I have reviewed the triage vital signs and the nursing notes.  Pertinent labs & imaging results that were available during my care of the patient were reviewed by me and considered in my medical decision making (see chart for details).   Is complaining of low back pain bilaterally with the righ being greater than left.  Patient has no spinal or paraspinous tenderness or spasm present on exam.  Patient's lower extremity strength  is 5/5 bilaterally.  Patellar and ankle reflexes are 2+ and equal  bilaterally.  Patient does have a positive straight leg raise on the right side.  Patient states that he has had sciatic in the past and his exam is consistent with sciatica.  Will treat with a steroid pack and back range of motion exercises.   Final Clinical Impressions(s) / UC Diagnoses   Final diagnoses:  Acute bilateral low back pain with right-sided sciatica     Discharge Instructions     Take the prednisone as directed.  Follow the back range of motion exercises in your discharge paperwork.  Consider revisiting the chiropractor for an adjustment as this may help your symptoms.  An inversion table can also be helpful at decompressing your spine and taking the weight off of your sciatic nerve.      ED Prescriptions    Medication Sig Dispense Auth. Provider   predniSONE (STERAPRED UNI-PAK 21 TAB) 10 MG (21) TBPK tablet Take by mouth daily. Take 6 tabs by mouth daily  for 2 days, then 5 tabs for 2 days, then 4 tabs for 2 days, then 3 tabs for 2 days, 2 tabs for 2 days, then 1 tab by mouth daily for 2 days 42 tablet Margarette Canada, NP     PDMP not reviewed this encounter.   Margarette Canada, NP 05/04/20 1242

## 2020-05-04 NOTE — ED Triage Notes (Signed)
Patient complains of low back pain that has been off and on for a while. States that he jerked trying to move his cat a few days ago and feels like it worse. States that he has been having radiation down both legs.

## 2020-05-04 NOTE — Discharge Instructions (Addendum)
Take the prednisone as directed.  Follow the back range of motion exercises in your discharge paperwork.  Consider revisiting the chiropractor for an adjustment as this may help your symptoms.  An inversion table can also be helpful at decompressing your spine and taking the weight off of your sciatic nerve.

## 2021-01-14 ENCOUNTER — Encounter: Payer: Self-pay | Admitting: General Surgery
